# Patient Record
Sex: Female | Born: 1945 | Race: White | Hispanic: No | Marital: Married | Smoking: Never smoker
Health system: Southern US, Community
[De-identification: ages and names within clinical notes are randomized; demographics above are authoritative.]

## PROBLEM LIST (undated history)

## (undated) DIAGNOSIS — I4819 Other persistent atrial fibrillation: Secondary | ICD-10-CM

## (undated) DIAGNOSIS — E079 Disorder of thyroid, unspecified: Secondary | ICD-10-CM

## (undated) DIAGNOSIS — I5020 Unspecified systolic (congestive) heart failure: Secondary | ICD-10-CM

## (undated) DIAGNOSIS — I10 Essential (primary) hypertension: Secondary | ICD-10-CM

## (undated) HISTORY — PX: APPENDECTOMY: SHX54

## (undated) HISTORY — DX: Unspecified systolic (congestive) heart failure: I50.20

## (undated) HISTORY — PX: HYSTERECTOMY ABDOMINAL WITH SALPINGECTOMY: SHX6725

## (undated) HISTORY — DX: Essential (primary) hypertension: I10

## (undated) HISTORY — DX: Other persistent atrial fibrillation: I48.19

## (undated) HISTORY — DX: Disorder of thyroid, unspecified: E07.9

---

## 2001-08-09 ENCOUNTER — Encounter: Payer: Self-pay | Admitting: Endocrinology

## 2001-08-09 ENCOUNTER — Encounter: Admission: RE | Admit: 2001-08-09 | Discharge: 2001-08-09 | Payer: Self-pay | Admitting: Endocrinology

## 2003-04-15 ENCOUNTER — Encounter: Payer: Self-pay | Admitting: Endocrinology

## 2003-04-15 ENCOUNTER — Encounter: Admission: RE | Admit: 2003-04-15 | Discharge: 2003-04-15 | Payer: Self-pay | Admitting: Endocrinology

## 2004-11-25 ENCOUNTER — Encounter: Admission: RE | Admit: 2004-11-25 | Discharge: 2004-11-25 | Payer: Self-pay | Admitting: Endocrinology

## 2007-02-08 ENCOUNTER — Encounter: Admission: RE | Admit: 2007-02-08 | Discharge: 2007-02-08 | Payer: Self-pay | Admitting: Endocrinology

## 2010-12-07 ENCOUNTER — Other Ambulatory Visit: Payer: Self-pay | Admitting: Endocrinology

## 2010-12-07 DIAGNOSIS — Z1231 Encounter for screening mammogram for malignant neoplasm of breast: Secondary | ICD-10-CM

## 2010-12-30 ENCOUNTER — Ambulatory Visit
Admission: RE | Admit: 2010-12-30 | Discharge: 2010-12-30 | Disposition: A | Discharge status: Home / Self Care | Payer: 59 | Source: Ambulatory Visit | Attending: Endocrinology | Admitting: Endocrinology

## 2010-12-30 DIAGNOSIS — Z1231 Encounter for screening mammogram for malignant neoplasm of breast: Secondary | ICD-10-CM

## 2011-11-16 DIAGNOSIS — E039 Hypothyroidism, unspecified: Secondary | ICD-10-CM | POA: Diagnosis not present

## 2011-11-18 DIAGNOSIS — E039 Hypothyroidism, unspecified: Secondary | ICD-10-CM | POA: Diagnosis not present

## 2011-11-18 DIAGNOSIS — I10 Essential (primary) hypertension: Secondary | ICD-10-CM | POA: Diagnosis not present

## 2011-11-26 DIAGNOSIS — H2510 Age-related nuclear cataract, unspecified eye: Secondary | ICD-10-CM | POA: Diagnosis not present

## 2011-12-06 DIAGNOSIS — H2510 Age-related nuclear cataract, unspecified eye: Secondary | ICD-10-CM | POA: Diagnosis not present

## 2011-12-06 DIAGNOSIS — IMO0002 Reserved for concepts with insufficient information to code with codable children: Secondary | ICD-10-CM | POA: Diagnosis not present

## 2012-03-01 DIAGNOSIS — E669 Obesity, unspecified: Secondary | ICD-10-CM | POA: Diagnosis not present

## 2012-03-01 DIAGNOSIS — I10 Essential (primary) hypertension: Secondary | ICD-10-CM | POA: Diagnosis not present

## 2012-03-01 DIAGNOSIS — Z1331 Encounter for screening for depression: Secondary | ICD-10-CM | POA: Diagnosis not present

## 2012-03-01 DIAGNOSIS — Z1211 Encounter for screening for malignant neoplasm of colon: Secondary | ICD-10-CM | POA: Diagnosis not present

## 2012-05-16 DIAGNOSIS — E039 Hypothyroidism, unspecified: Secondary | ICD-10-CM | POA: Diagnosis not present

## 2012-05-16 DIAGNOSIS — I10 Essential (primary) hypertension: Secondary | ICD-10-CM | POA: Diagnosis not present

## 2012-05-18 DIAGNOSIS — I10 Essential (primary) hypertension: Secondary | ICD-10-CM | POA: Diagnosis not present

## 2012-05-18 DIAGNOSIS — E039 Hypothyroidism, unspecified: Secondary | ICD-10-CM | POA: Diagnosis not present

## 2012-08-31 DIAGNOSIS — Z13220 Encounter for screening for lipoid disorders: Secondary | ICD-10-CM | POA: Diagnosis not present

## 2012-08-31 DIAGNOSIS — I10 Essential (primary) hypertension: Secondary | ICD-10-CM | POA: Diagnosis not present

## 2012-08-31 DIAGNOSIS — M25559 Pain in unspecified hip: Secondary | ICD-10-CM | POA: Diagnosis not present

## 2012-08-31 DIAGNOSIS — Z136 Encounter for screening for cardiovascular disorders: Secondary | ICD-10-CM | POA: Diagnosis not present

## 2012-11-21 DIAGNOSIS — E039 Hypothyroidism, unspecified: Secondary | ICD-10-CM | POA: Diagnosis not present

## 2012-11-23 DIAGNOSIS — E039 Hypothyroidism, unspecified: Secondary | ICD-10-CM | POA: Diagnosis not present

## 2013-05-22 DIAGNOSIS — E039 Hypothyroidism, unspecified: Secondary | ICD-10-CM | POA: Diagnosis not present

## 2013-05-24 DIAGNOSIS — E039 Hypothyroidism, unspecified: Secondary | ICD-10-CM | POA: Diagnosis not present

## 2013-08-10 DIAGNOSIS — Z23 Encounter for immunization: Secondary | ICD-10-CM | POA: Diagnosis not present

## 2013-10-29 DIAGNOSIS — I10 Essential (primary) hypertension: Secondary | ICD-10-CM | POA: Diagnosis not present

## 2013-11-23 DIAGNOSIS — E039 Hypothyroidism, unspecified: Secondary | ICD-10-CM | POA: Diagnosis not present

## 2013-11-26 DIAGNOSIS — I10 Essential (primary) hypertension: Secondary | ICD-10-CM | POA: Diagnosis not present

## 2013-11-27 DIAGNOSIS — E039 Hypothyroidism, unspecified: Secondary | ICD-10-CM | POA: Diagnosis not present

## 2013-12-05 DIAGNOSIS — I10 Essential (primary) hypertension: Secondary | ICD-10-CM | POA: Diagnosis not present

## 2013-12-25 DIAGNOSIS — I10 Essential (primary) hypertension: Secondary | ICD-10-CM | POA: Diagnosis not present

## 2014-02-01 DIAGNOSIS — I10 Essential (primary) hypertension: Secondary | ICD-10-CM | POA: Diagnosis not present

## 2014-04-26 ENCOUNTER — Other Ambulatory Visit: Payer: Self-pay

## 2014-04-26 DIAGNOSIS — Z1231 Encounter for screening mammogram for malignant neoplasm of breast: Secondary | ICD-10-CM

## 2014-05-14 ENCOUNTER — Encounter (INDEPENDENT_AMBULATORY_CARE_PROVIDER_SITE_OTHER): Payer: Self-pay

## 2014-05-14 ENCOUNTER — Ambulatory Visit
Admission: RE | Admit: 2014-05-14 | Discharge: 2014-05-14 | Disposition: A | Discharge status: Home / Self Care | Payer: Medicare Other | Source: Ambulatory Visit

## 2014-05-14 DIAGNOSIS — Z1231 Encounter for screening mammogram for malignant neoplasm of breast: Secondary | ICD-10-CM | POA: Diagnosis not present

## 2014-05-27 DIAGNOSIS — E039 Hypothyroidism, unspecified: Secondary | ICD-10-CM | POA: Diagnosis not present

## 2014-05-30 DIAGNOSIS — E039 Hypothyroidism, unspecified: Secondary | ICD-10-CM | POA: Diagnosis not present

## 2014-06-11 DIAGNOSIS — Z5181 Encounter for therapeutic drug level monitoring: Secondary | ICD-10-CM | POA: Diagnosis not present

## 2014-06-11 DIAGNOSIS — Z1211 Encounter for screening for malignant neoplasm of colon: Secondary | ICD-10-CM | POA: Diagnosis not present

## 2014-06-11 DIAGNOSIS — Z6837 Body mass index (BMI) 37.0-37.9, adult: Secondary | ICD-10-CM | POA: Diagnosis not present

## 2014-06-11 DIAGNOSIS — Z23 Encounter for immunization: Secondary | ICD-10-CM | POA: Diagnosis not present

## 2014-06-11 DIAGNOSIS — I10 Essential (primary) hypertension: Secondary | ICD-10-CM | POA: Diagnosis not present

## 2014-06-13 DIAGNOSIS — Z1211 Encounter for screening for malignant neoplasm of colon: Secondary | ICD-10-CM | POA: Diagnosis not present

## 2014-11-26 DIAGNOSIS — E038 Other specified hypothyroidism: Secondary | ICD-10-CM | POA: Diagnosis not present

## 2014-11-27 DIAGNOSIS — H2512 Age-related nuclear cataract, left eye: Secondary | ICD-10-CM | POA: Diagnosis not present

## 2014-11-27 DIAGNOSIS — H11002 Unspecified pterygium of left eye: Secondary | ICD-10-CM | POA: Diagnosis not present

## 2014-11-28 DIAGNOSIS — E038 Other specified hypothyroidism: Secondary | ICD-10-CM | POA: Diagnosis not present

## 2014-11-28 DIAGNOSIS — Z23 Encounter for immunization: Secondary | ICD-10-CM | POA: Diagnosis not present

## 2014-12-10 DIAGNOSIS — M25559 Pain in unspecified hip: Secondary | ICD-10-CM | POA: Diagnosis not present

## 2014-12-10 DIAGNOSIS — M545 Low back pain: Secondary | ICD-10-CM | POA: Diagnosis not present

## 2014-12-10 DIAGNOSIS — I10 Essential (primary) hypertension: Secondary | ICD-10-CM | POA: Diagnosis not present

## 2014-12-10 DIAGNOSIS — Z6838 Body mass index (BMI) 38.0-38.9, adult: Secondary | ICD-10-CM | POA: Diagnosis not present

## 2015-05-26 DIAGNOSIS — E038 Other specified hypothyroidism: Secondary | ICD-10-CM | POA: Diagnosis not present

## 2015-05-27 DIAGNOSIS — Z23 Encounter for immunization: Secondary | ICD-10-CM | POA: Diagnosis not present

## 2015-05-27 DIAGNOSIS — E038 Other specified hypothyroidism: Secondary | ICD-10-CM | POA: Diagnosis not present

## 2015-06-11 DIAGNOSIS — Z0000 Encounter for general adult medical examination without abnormal findings: Secondary | ICD-10-CM | POA: Diagnosis not present

## 2015-06-11 DIAGNOSIS — Z1211 Encounter for screening for malignant neoplasm of colon: Secondary | ICD-10-CM | POA: Diagnosis not present

## 2015-06-11 DIAGNOSIS — I10 Essential (primary) hypertension: Secondary | ICD-10-CM | POA: Diagnosis not present

## 2015-06-11 DIAGNOSIS — E039 Hypothyroidism, unspecified: Secondary | ICD-10-CM | POA: Diagnosis not present

## 2015-06-11 DIAGNOSIS — Z6838 Body mass index (BMI) 38.0-38.9, adult: Secondary | ICD-10-CM | POA: Diagnosis not present

## 2015-06-11 DIAGNOSIS — M1612 Unilateral primary osteoarthritis, left hip: Secondary | ICD-10-CM | POA: Diagnosis not present

## 2015-06-19 DIAGNOSIS — Z1211 Encounter for screening for malignant neoplasm of colon: Secondary | ICD-10-CM | POA: Diagnosis not present

## 2015-06-19 DIAGNOSIS — R300 Dysuria: Secondary | ICD-10-CM | POA: Diagnosis not present

## 2015-07-10 DIAGNOSIS — Z23 Encounter for immunization: Secondary | ICD-10-CM | POA: Diagnosis not present

## 2015-10-28 ENCOUNTER — Other Ambulatory Visit: Payer: Self-pay

## 2015-10-28 DIAGNOSIS — Z1231 Encounter for screening mammogram for malignant neoplasm of breast: Secondary | ICD-10-CM

## 2015-11-13 ENCOUNTER — Ambulatory Visit
Admission: RE | Admit: 2015-11-13 | Discharge: 2015-11-13 | Disposition: A | Discharge status: Home / Self Care | Payer: Medicare Other | Source: Ambulatory Visit

## 2015-11-13 DIAGNOSIS — Z1231 Encounter for screening mammogram for malignant neoplasm of breast: Secondary | ICD-10-CM

## 2015-11-25 DIAGNOSIS — E038 Other specified hypothyroidism: Secondary | ICD-10-CM | POA: Diagnosis not present

## 2015-11-27 DIAGNOSIS — E038 Other specified hypothyroidism: Secondary | ICD-10-CM | POA: Diagnosis not present

## 2015-12-02 DIAGNOSIS — Z961 Presence of intraocular lens: Secondary | ICD-10-CM | POA: Diagnosis not present

## 2015-12-02 DIAGNOSIS — H2512 Age-related nuclear cataract, left eye: Secondary | ICD-10-CM | POA: Diagnosis not present

## 2015-12-09 DIAGNOSIS — I10 Essential (primary) hypertension: Secondary | ICD-10-CM | POA: Diagnosis not present

## 2015-12-09 DIAGNOSIS — E782 Mixed hyperlipidemia: Secondary | ICD-10-CM | POA: Diagnosis not present

## 2015-12-09 DIAGNOSIS — M1612 Unilateral primary osteoarthritis, left hip: Secondary | ICD-10-CM | POA: Diagnosis not present

## 2016-03-03 DIAGNOSIS — E038 Other specified hypothyroidism: Secondary | ICD-10-CM | POA: Diagnosis not present

## 2016-03-05 DIAGNOSIS — E038 Other specified hypothyroidism: Secondary | ICD-10-CM | POA: Diagnosis not present

## 2016-06-14 DIAGNOSIS — Z1211 Encounter for screening for malignant neoplasm of colon: Secondary | ICD-10-CM | POA: Diagnosis not present

## 2016-06-14 DIAGNOSIS — I10 Essential (primary) hypertension: Secondary | ICD-10-CM | POA: Diagnosis not present

## 2016-06-14 DIAGNOSIS — E669 Obesity, unspecified: Secondary | ICD-10-CM | POA: Diagnosis not present

## 2016-06-15 DIAGNOSIS — E038 Other specified hypothyroidism: Secondary | ICD-10-CM | POA: Diagnosis not present

## 2016-06-17 DIAGNOSIS — E038 Other specified hypothyroidism: Secondary | ICD-10-CM | POA: Diagnosis not present

## 2016-06-17 DIAGNOSIS — Z23 Encounter for immunization: Secondary | ICD-10-CM | POA: Diagnosis not present

## 2016-06-21 DIAGNOSIS — Z1211 Encounter for screening for malignant neoplasm of colon: Secondary | ICD-10-CM | POA: Diagnosis not present

## 2016-09-28 DIAGNOSIS — H33301 Unspecified retinal break, right eye: Secondary | ICD-10-CM | POA: Diagnosis not present

## 2016-09-29 DIAGNOSIS — H4311 Vitreous hemorrhage, right eye: Secondary | ICD-10-CM | POA: Diagnosis not present

## 2016-09-29 DIAGNOSIS — H43813 Vitreous degeneration, bilateral: Secondary | ICD-10-CM | POA: Diagnosis not present

## 2016-09-29 DIAGNOSIS — H33311 Horseshoe tear of retina without detachment, right eye: Secondary | ICD-10-CM | POA: Diagnosis not present

## 2016-10-08 DIAGNOSIS — H33311 Horseshoe tear of retina without detachment, right eye: Secondary | ICD-10-CM | POA: Diagnosis not present

## 2016-11-30 DIAGNOSIS — E039 Hypothyroidism, unspecified: Secondary | ICD-10-CM | POA: Diagnosis not present

## 2016-12-02 DIAGNOSIS — H4312 Vitreous hemorrhage, left eye: Secondary | ICD-10-CM | POA: Diagnosis not present

## 2016-12-24 DIAGNOSIS — E039 Hypothyroidism, unspecified: Secondary | ICD-10-CM | POA: Insufficient documentation

## 2016-12-24 DIAGNOSIS — Z79899 Other long term (current) drug therapy: Secondary | ICD-10-CM | POA: Diagnosis not present

## 2017-01-11 DIAGNOSIS — E039 Hypothyroidism, unspecified: Secondary | ICD-10-CM | POA: Diagnosis not present

## 2017-01-11 DIAGNOSIS — I10 Essential (primary) hypertension: Secondary | ICD-10-CM | POA: Diagnosis not present

## 2017-01-11 DIAGNOSIS — Z6839 Body mass index (BMI) 39.0-39.9, adult: Secondary | ICD-10-CM | POA: Diagnosis not present

## 2017-01-11 DIAGNOSIS — Z0000 Encounter for general adult medical examination without abnormal findings: Secondary | ICD-10-CM | POA: Diagnosis not present

## 2017-01-11 DIAGNOSIS — M1612 Unilateral primary osteoarthritis, left hip: Secondary | ICD-10-CM | POA: Diagnosis not present

## 2017-01-11 DIAGNOSIS — E782 Mixed hyperlipidemia: Secondary | ICD-10-CM | POA: Diagnosis not present

## 2017-01-11 DIAGNOSIS — Z23 Encounter for immunization: Secondary | ICD-10-CM | POA: Diagnosis not present

## 2017-02-01 DIAGNOSIS — H4311 Vitreous hemorrhage, right eye: Secondary | ICD-10-CM | POA: Diagnosis not present

## 2017-04-07 DIAGNOSIS — H33301 Unspecified retinal break, right eye: Secondary | ICD-10-CM | POA: Diagnosis not present

## 2017-06-17 DIAGNOSIS — E039 Hypothyroidism, unspecified: Secondary | ICD-10-CM | POA: Diagnosis not present

## 2017-06-27 DIAGNOSIS — E039 Hypothyroidism, unspecified: Secondary | ICD-10-CM | POA: Diagnosis not present

## 2017-06-27 DIAGNOSIS — Z23 Encounter for immunization: Secondary | ICD-10-CM | POA: Diagnosis not present

## 2017-10-11 DIAGNOSIS — D3141 Benign neoplasm of right ciliary body: Secondary | ICD-10-CM | POA: Diagnosis not present

## 2017-10-11 DIAGNOSIS — H26492 Other secondary cataract, left eye: Secondary | ICD-10-CM | POA: Diagnosis not present

## 2017-10-11 DIAGNOSIS — D3131 Benign neoplasm of right choroid: Secondary | ICD-10-CM | POA: Diagnosis not present

## 2017-10-21 ENCOUNTER — Other Ambulatory Visit: Payer: Self-pay | Admitting: Family Medicine

## 2017-10-21 DIAGNOSIS — Z139 Encounter for screening, unspecified: Secondary | ICD-10-CM

## 2017-11-15 ENCOUNTER — Ambulatory Visit
Admission: RE | Admit: 2017-11-15 | Discharge: 2017-11-15 | Disposition: A | Discharge status: Home / Self Care | Payer: Medicare Other | Source: Ambulatory Visit | Attending: Family Medicine | Admitting: Family Medicine

## 2017-11-15 DIAGNOSIS — Z1231 Encounter for screening mammogram for malignant neoplasm of breast: Secondary | ICD-10-CM | POA: Diagnosis not present

## 2017-11-15 DIAGNOSIS — Z139 Encounter for screening, unspecified: Secondary | ICD-10-CM

## 2017-11-16 DIAGNOSIS — H26491 Other secondary cataract, right eye: Secondary | ICD-10-CM | POA: Diagnosis not present

## 2017-12-22 DIAGNOSIS — E039 Hypothyroidism, unspecified: Secondary | ICD-10-CM | POA: Diagnosis not present

## 2017-12-26 DIAGNOSIS — E039 Hypothyroidism, unspecified: Secondary | ICD-10-CM | POA: Diagnosis not present

## 2017-12-26 DIAGNOSIS — Z8639 Personal history of other endocrine, nutritional and metabolic disease: Secondary | ICD-10-CM | POA: Diagnosis not present

## 2017-12-26 DIAGNOSIS — Z79890 Hormone replacement therapy (postmenopausal): Secondary | ICD-10-CM | POA: Diagnosis not present

## 2018-03-03 DIAGNOSIS — E782 Mixed hyperlipidemia: Secondary | ICD-10-CM | POA: Diagnosis not present

## 2018-03-03 DIAGNOSIS — Z0000 Encounter for general adult medical examination without abnormal findings: Secondary | ICD-10-CM | POA: Diagnosis not present

## 2018-03-03 DIAGNOSIS — M1612 Unilateral primary osteoarthritis, left hip: Secondary | ICD-10-CM | POA: Diagnosis not present

## 2018-03-03 DIAGNOSIS — Z1211 Encounter for screening for malignant neoplasm of colon: Secondary | ICD-10-CM | POA: Diagnosis not present

## 2018-03-03 DIAGNOSIS — I10 Essential (primary) hypertension: Secondary | ICD-10-CM | POA: Diagnosis not present

## 2018-03-03 DIAGNOSIS — E039 Hypothyroidism, unspecified: Secondary | ICD-10-CM | POA: Diagnosis not present

## 2018-06-21 DIAGNOSIS — E039 Hypothyroidism, unspecified: Secondary | ICD-10-CM | POA: Diagnosis not present

## 2018-06-27 DIAGNOSIS — Z23 Encounter for immunization: Secondary | ICD-10-CM | POA: Diagnosis not present

## 2018-06-27 DIAGNOSIS — E039 Hypothyroidism, unspecified: Secondary | ICD-10-CM | POA: Diagnosis not present

## 2018-09-04 DIAGNOSIS — E039 Hypothyroidism, unspecified: Secondary | ICD-10-CM | POA: Diagnosis not present

## 2018-09-04 DIAGNOSIS — I10 Essential (primary) hypertension: Secondary | ICD-10-CM | POA: Diagnosis not present

## 2018-09-04 DIAGNOSIS — M1612 Unilateral primary osteoarthritis, left hip: Secondary | ICD-10-CM | POA: Diagnosis not present

## 2018-10-05 DIAGNOSIS — H524 Presbyopia: Secondary | ICD-10-CM | POA: Diagnosis not present

## 2018-10-05 DIAGNOSIS — D3131 Benign neoplasm of right choroid: Secondary | ICD-10-CM | POA: Diagnosis not present

## 2018-10-05 DIAGNOSIS — H33301 Unspecified retinal break, right eye: Secondary | ICD-10-CM | POA: Diagnosis not present

## 2018-12-22 DIAGNOSIS — E039 Hypothyroidism, unspecified: Secondary | ICD-10-CM | POA: Diagnosis not present

## 2018-12-28 DIAGNOSIS — E039 Hypothyroidism, unspecified: Secondary | ICD-10-CM | POA: Diagnosis not present

## 2019-03-07 DIAGNOSIS — I10 Essential (primary) hypertension: Secondary | ICD-10-CM | POA: Diagnosis not present

## 2019-03-07 DIAGNOSIS — E782 Mixed hyperlipidemia: Secondary | ICD-10-CM | POA: Diagnosis not present

## 2019-03-07 DIAGNOSIS — E039 Hypothyroidism, unspecified: Secondary | ICD-10-CM | POA: Diagnosis not present

## 2019-03-07 DIAGNOSIS — M1612 Unilateral primary osteoarthritis, left hip: Secondary | ICD-10-CM | POA: Diagnosis not present

## 2019-07-03 DIAGNOSIS — E039 Hypothyroidism, unspecified: Secondary | ICD-10-CM | POA: Diagnosis not present

## 2019-07-05 DIAGNOSIS — Z23 Encounter for immunization: Secondary | ICD-10-CM | POA: Diagnosis not present

## 2019-07-05 DIAGNOSIS — E039 Hypothyroidism, unspecified: Secondary | ICD-10-CM | POA: Diagnosis not present

## 2019-08-14 DIAGNOSIS — H43813 Vitreous degeneration, bilateral: Secondary | ICD-10-CM | POA: Diagnosis not present

## 2019-08-14 DIAGNOSIS — H43811 Vitreous degeneration, right eye: Secondary | ICD-10-CM | POA: Diagnosis not present

## 2019-08-14 DIAGNOSIS — H4311 Vitreous hemorrhage, right eye: Secondary | ICD-10-CM | POA: Diagnosis not present

## 2019-08-14 DIAGNOSIS — H35371 Puckering of macula, right eye: Secondary | ICD-10-CM | POA: Diagnosis not present

## 2019-08-14 DIAGNOSIS — H43393 Other vitreous opacities, bilateral: Secondary | ICD-10-CM | POA: Diagnosis not present

## 2019-11-13 DIAGNOSIS — H2512 Age-related nuclear cataract, left eye: Secondary | ICD-10-CM | POA: Diagnosis not present

## 2019-11-13 DIAGNOSIS — H35371 Puckering of macula, right eye: Secondary | ICD-10-CM | POA: Diagnosis not present

## 2019-11-13 DIAGNOSIS — H43813 Vitreous degeneration, bilateral: Secondary | ICD-10-CM | POA: Diagnosis not present

## 2019-11-13 DIAGNOSIS — H43393 Other vitreous opacities, bilateral: Secondary | ICD-10-CM | POA: Diagnosis not present

## 2019-11-29 DIAGNOSIS — Z23 Encounter for immunization: Secondary | ICD-10-CM | POA: Diagnosis not present

## 2020-01-01 DIAGNOSIS — E039 Hypothyroidism, unspecified: Secondary | ICD-10-CM | POA: Diagnosis not present

## 2020-01-04 DIAGNOSIS — E039 Hypothyroidism, unspecified: Secondary | ICD-10-CM | POA: Diagnosis not present

## 2020-01-04 DIAGNOSIS — Z8639 Personal history of other endocrine, nutritional and metabolic disease: Secondary | ICD-10-CM | POA: Diagnosis not present

## 2020-01-04 DIAGNOSIS — Z23 Encounter for immunization: Secondary | ICD-10-CM | POA: Diagnosis not present

## 2020-03-11 DIAGNOSIS — I10 Essential (primary) hypertension: Secondary | ICD-10-CM | POA: Diagnosis not present

## 2020-04-21 ENCOUNTER — Other Ambulatory Visit: Payer: Self-pay | Admitting: Family Medicine

## 2020-04-21 DIAGNOSIS — Z1231 Encounter for screening mammogram for malignant neoplasm of breast: Secondary | ICD-10-CM

## 2020-05-09 DIAGNOSIS — H35371 Puckering of macula, right eye: Secondary | ICD-10-CM | POA: Diagnosis not present

## 2020-05-09 DIAGNOSIS — H31091 Other chorioretinal scars, right eye: Secondary | ICD-10-CM | POA: Diagnosis not present

## 2020-05-09 DIAGNOSIS — H43391 Other vitreous opacities, right eye: Secondary | ICD-10-CM | POA: Diagnosis not present

## 2020-05-09 DIAGNOSIS — H43813 Vitreous degeneration, bilateral: Secondary | ICD-10-CM | POA: Diagnosis not present

## 2020-05-15 ENCOUNTER — Other Ambulatory Visit: Payer: Self-pay

## 2020-05-15 ENCOUNTER — Ambulatory Visit
Admission: RE | Admit: 2020-05-15 | Discharge: 2020-05-15 | Disposition: A | Discharge status: Home / Self Care | Payer: Medicare Other | Source: Ambulatory Visit | Attending: Family Medicine | Admitting: Family Medicine

## 2020-05-15 DIAGNOSIS — Z1231 Encounter for screening mammogram for malignant neoplasm of breast: Secondary | ICD-10-CM

## 2020-07-07 DIAGNOSIS — E039 Hypothyroidism, unspecified: Secondary | ICD-10-CM | POA: Diagnosis not present

## 2020-07-07 DIAGNOSIS — Z8639 Personal history of other endocrine, nutritional and metabolic disease: Secondary | ICD-10-CM | POA: Diagnosis not present

## 2020-07-11 DIAGNOSIS — Z8639 Personal history of other endocrine, nutritional and metabolic disease: Secondary | ICD-10-CM | POA: Diagnosis not present

## 2020-07-11 DIAGNOSIS — E039 Hypothyroidism, unspecified: Secondary | ICD-10-CM | POA: Diagnosis not present

## 2020-07-18 ENCOUNTER — Other Ambulatory Visit: Payer: Self-pay

## 2020-07-18 ENCOUNTER — Telehealth: Payer: Self-pay

## 2020-07-18 ENCOUNTER — Encounter: Payer: Self-pay | Admitting: Cardiovascular Disease

## 2020-07-18 ENCOUNTER — Ambulatory Visit (INDEPENDENT_AMBULATORY_CARE_PROVIDER_SITE_OTHER): Payer: Medicare Other | Admitting: Cardiovascular Disease

## 2020-07-18 VITALS — BP 128/78 | HR 90 | Ht 63.0 in | Wt 216.0 lb

## 2020-07-18 DIAGNOSIS — I4891 Unspecified atrial fibrillation: Secondary | ICD-10-CM | POA: Diagnosis not present

## 2020-07-18 DIAGNOSIS — Z23 Encounter for immunization: Secondary | ICD-10-CM | POA: Diagnosis not present

## 2020-07-18 DIAGNOSIS — I10 Essential (primary) hypertension: Secondary | ICD-10-CM | POA: Diagnosis not present

## 2020-07-18 DIAGNOSIS — R000 Tachycardia, unspecified: Secondary | ICD-10-CM | POA: Diagnosis not present

## 2020-07-18 MED ORDER — METOPROLOL TARTRATE 25 MG PO TABS: 25.0000 mg | ORAL_TABLET | Freq: Two times a day (BID) | ORAL | 3 refills | Status: DC

## 2020-07-18 MED ORDER — APIXABAN 5 MG PO TABS: 5.0000 mg | ORAL_TABLET | Freq: Two times a day (BID) | ORAL | 3 refills | Status: DC

## 2020-07-18 NOTE — Progress Notes (Signed)
Cardiology Office Note:    Date:  07/18/2020   ID:  Michelle Watkins, DOB 05-17-1946, MRN [id number]  PCP:  Orpah Melter, MD  [facility] Cardiologist:  No primary care provider on file.  [facility] Electrophysiologist:  None   Referring MD: Orpah Melter, MD   Chief Complaint  Patient presents with  . Atrial Fibrillation    History of Present Illness:    Michelle Watkins is a 74 y.o. female referred for evaluation of atrial fibrillation by Dr Olen Pel.   The patient is here alone. She was seen by her endocrinologist last week and was noted to have a pulse rate of 126 bpm. She states her normal heart rate is in the range of 60-70's. She went to see Dr Olen Pel this morning and was noted to be in atrial fibrillation with RVR with a heart rate of 147 bpm. She has been checking her pulse at home and it has generally ranged from 100-140 bpm over the past week.   She denies any perception of heart palpitations or 'heart racing.' She hasn't done much physical activity this week because she has been concerned about her heart rate. She specifically denies chest pain, chest pressure, or shortness of breath. She denies lightheadedness or syncope. She recently had to place her mother in a skilled nursing facility and has had to sell her mother's house. States that all of this has been very stressful for her.   Past Medical History:  Diagnosis Date  . Hypertension   . Thyroid disease     Past Surgical History:  Procedure Laterality Date  . APPENDECTOMY    . HYSTERECTOMY ABDOMINAL WITH SALPINGECTOMY      Current Medications: Current Meds  Medication Sig  . acetaminophen (TYLENOL) 650 MG CR tablet Take 650 mg by mouth every 8 (eight) hours as needed.  Marland Kitchen amLODipine (NORVASC) 2.5 MG tablet Take 2.5 mg by mouth daily.  Marland Kitchen levothyroxine (SYNTHROID) 125 MCG tablet Take 125 mcg by mouth daily before breakfast.  . lisinopril (ZESTRIL) 40 MG tablet Take 40 mg by mouth daily.     Allergies:    Patient has no allergy information on record.   Social History   Socioeconomic History  . Marital status: Married    Spouse name: Not on file  . Number of children: Not on file  . Years of education: Not on file  . Highest education level: Not on file  Occupational History  . Not on file  Tobacco Use  . Smoking status: Never Smoker  . Smokeless tobacco: Never Used  Substance and Sexual Activity  . Alcohol use: Never  . Drug use: Never  . Sexual activity: Not on file  Other Topics Concern  . Not on file  Social History Narrative  . Not on file   Social Determinants of Health   Financial Resource Strain:   . Difficulty of Paying Living Expenses: Not on file  Food Insecurity:   . Worried About Charity fundraiser in the Last Year: Not on file  . Ran Out of Food in the Last Year: Not on file  Transportation Needs:   . Lack of Transportation (Medical): Not on file  . Lack of Transportation (Non-Medical): Not on file  Physical Activity:   . Days of Exercise per Week: Not on file  . Minutes of Exercise per Session: Not on file  Stress:   . Feeling of Stress : Not on file  Social Connections:   . Frequency of Communication  with Friends and Family: Not on file  . Frequency of Social Gatherings with Friends and Family: Not on file  . Attends Religious Services: Not on file  . Active Member of Clubs or Organizations: Not on file  . Attends Archivist Meetings: Not on file  . Marital Status: Not on file     Family History: The patient's family history is not on file.  ROS:   Please see the history of present illness.    All other systems reviewed and are negative.  EKGs/Labs/Other Studies Reviewed:    EKG:  EKG is not ordered today.  The ekg ordered today by Dr Olen Pel demonstrates atrial fibrillation with RVR 147 bpm  Recent Labs: No results found for requested labs within last 8760 hours.  Recent Lipid Panel No results found for: CHOL, TRIG, HDL,  CHOLHDL, VLDL, LDLCALC, LDLDIRECT   Risk Assessment/Calculations:     CHA2DS2-VASc Score = 3  This indicates a 3.2% annual risk of stroke. The patient's score is based upon: CHF History: 0 HTN History: 1 Diabetes History: 0 Stroke History: 0 Vascular Disease History: 0 Age Score: 1 Gender Score: 1      Physical Exam:    VS:  BP 128/78   Pulse 90   Ht 5\' 3"  (1.6 m)   Wt 216 lb (98 kg)   SpO2 98%   BMI 38.26 kg/m     Wt Readings from Last 3 Encounters:  07/18/20 216 lb (98 kg)     GEN:  Well nourished, well developed in no acute distress HEENT: Normal NECK: No JVD; No carotid bruits LYMPHATICS: No lymphadenopathy CARDIAC: irregularly irregular, no murmurs, rubs, gallops RESPIRATORY:  Clear to auscultation without rales, wheezing or rhonchi  ABDOMEN: Soft, non-tender, non-distended MUSCULOSKELETAL:  No edema; No deformity  SKIN: Warm and dry NEUROLOGIC:  Alert and oriented x 3 PSYCHIATRIC:  Normal affect   ASSESSMENT:    1. Atrial fibrillation with rapid ventricular response ([place])   2. Essential hypertension    PLAN:    In order of problems listed above:  1. The patient has a new diagnosis of atrial fibrillation with RVR. She appears to be asymptomatic. By history it appears that she has been in atrial fibrillation for at least 1 week. We discussed the natural history of atrial fibrillation today. We reviewed treatment options and contrasted rate control versus rhythm control. We discussed pros and cons of oral anticoagulation therapy. This patient's CHA2DS2-VASc score is equal to 3 and oral anticoagulation is indicated. I have recommended apixaban 5 mg twice daily which she agrees to start immediately. We will add metoprolol 25 mg twice daily for heart rate control. She will discontinue lisinopril to allow plenty of blood pressure room for metoprolol. She was wanting to discontinue lisinopril because of chronic nonproductive cough. She has had recent thyroid  function testing done which is reportedly normal. Will obtain some baseline labs to include a CBC and metabolic panel. Will obtain an echocardiogram to assess for any evidence of LV dysfunction, valvular disease, and assessment of atrial size. Once initial testing is completed, I have recommended moving forward with elective cardioversion. I have reviewed risks, indications, and alternatives of cardioversion. She will need to complete 3 full weeks of anticoagulation prior to elective cardioversion. After cardioversion, we will ask her to follow-up in the atrial fibrillation clinic. 2. Appears to be controlled. Check echo to assess for LVH or any structural heart disease. Continue amlodipine 2.5 mg daily. Stop lisinopril as above.  Initiate metoprolol 25 mg twice daily for better heart rate control.   Medication Adjustments/Labs and Tests Ordered: Current medicines are reviewed at length with the patient today.  Concerns regarding medicines are outlined above.  No orders of the defined types were placed in this encounter.  No orders of the defined types were placed in this encounter.   There are no Patient Instructions on file for this visit.   Signed, Sherren Mocha, MD  07/18/2020 1:32 PM    [facility]

## 2020-07-18 NOTE — H&P (View-Only) (Signed)
Cardiology Office Note:    Date:  07/18/2020   ID:  Michelle Watkins, DOB September 12, 1946, MRN [id number]  PCP:  Orpah Melter, MD  [facility] Cardiologist:  No primary care provider on file.  [facility] Electrophysiologist:  None   Referring MD: Orpah Melter, MD   Chief Complaint  Patient presents with  . Atrial Fibrillation    History of Present Illness:    Michelle Watkins is a 74 y.o. female referred for evaluation of atrial fibrillation by Dr Olen Pel.   The patient is here alone. She was seen by her endocrinologist last week and was noted to have a pulse rate of 126 bpm. She states her normal heart rate is in the range of 60-70's. She went to see Dr Olen Pel this morning and was noted to be in atrial fibrillation with RVR with a heart rate of 147 bpm. She has been checking her pulse at home and it has generally ranged from 100-140 bpm over the past week.   She denies any perception of heart palpitations or 'heart racing.' She hasn't done much physical activity this week because she has been concerned about her heart rate. She specifically denies chest pain, chest pressure, or shortness of breath. She denies lightheadedness or syncope. She recently had to place her mother in a skilled nursing facility and has had to sell her mother's house. States that all of this has been very stressful for her.   Past Medical History:  Diagnosis Date  . Hypertension   . Thyroid disease     Past Surgical History:  Procedure Laterality Date  . APPENDECTOMY    . HYSTERECTOMY ABDOMINAL WITH SALPINGECTOMY      Current Medications: Current Meds  Medication Sig  . acetaminophen (TYLENOL) 650 MG CR tablet Take 650 mg by mouth every 8 (eight) hours as needed.  Marland Kitchen amLODipine (NORVASC) 2.5 MG tablet Take 2.5 mg by mouth daily.  Marland Kitchen levothyroxine (SYNTHROID) 125 MCG tablet Take 125 mcg by mouth daily before breakfast.  . lisinopril (ZESTRIL) 40 MG tablet Take 40 mg by mouth daily.     Allergies:    Patient has no allergy information on record.   Social History   Socioeconomic History  . Marital status: Married    Spouse name: Not on file  . Number of children: Not on file  . Years of education: Not on file  . Highest education level: Not on file  Occupational History  . Not on file  Tobacco Use  . Smoking status: Never Smoker  . Smokeless tobacco: Never Used  Substance and Sexual Activity  . Alcohol use: Never  . Drug use: Never  . Sexual activity: Not on file  Other Topics Concern  . Not on file  Social History Narrative  . Not on file   Social Determinants of Health   Financial Resource Strain:   . Difficulty of Paying Living Expenses: Not on file  Food Insecurity:   . Worried About Charity fundraiser in the Last Year: Not on file  . Ran Out of Food in the Last Year: Not on file  Transportation Needs:   . Lack of Transportation (Medical): Not on file  . Lack of Transportation (Non-Medical): Not on file  Physical Activity:   . Days of Exercise per Week: Not on file  . Minutes of Exercise per Session: Not on file  Stress:   . Feeling of Stress : Not on file  Social Connections:   . Frequency of Communication  with Friends and Family: Not on file  . Frequency of Social Gatherings with Friends and Family: Not on file  . Attends Religious Services: Not on file  . Active Member of Clubs or Organizations: Not on file  . Attends Archivist Meetings: Not on file  . Marital Status: Not on file     Family History: The patient's family history is not on file.  ROS:   Please see the history of present illness.    All other systems reviewed and are negative.  EKGs/Labs/Other Studies Reviewed:    EKG:  EKG is not ordered today.  The ekg ordered today by Dr Olen Pel demonstrates atrial fibrillation with RVR 147 bpm  Recent Labs: No results found for requested labs within last 8760 hours.  Recent Lipid Panel No results found for: CHOL, TRIG, HDL,  CHOLHDL, VLDL, LDLCALC, LDLDIRECT   Risk Assessment/Calculations:     CHA2DS2-VASc Score = 3  This indicates a 3.2% annual risk of stroke. The patient's score is based upon: CHF History: 0 HTN History: 1 Diabetes History: 0 Stroke History: 0 Vascular Disease History: 0 Age Score: 1 Gender Score: 1      Physical Exam:    VS:  BP 128/78   Pulse 90   Ht 5\' 3"  (1.6 m)   Wt 216 lb (98 kg)   SpO2 98%   BMI 38.26 kg/m     Wt Readings from Last 3 Encounters:  07/18/20 216 lb (98 kg)     GEN:  Well nourished, well developed in no acute distress HEENT: Normal NECK: No JVD; No carotid bruits LYMPHATICS: No lymphadenopathy CARDIAC: irregularly irregular, no murmurs, rubs, gallops RESPIRATORY:  Clear to auscultation without rales, wheezing or rhonchi  ABDOMEN: Soft, non-tender, non-distended MUSCULOSKELETAL:  No edema; No deformity  SKIN: Warm and dry NEUROLOGIC:  Alert and oriented x 3 PSYCHIATRIC:  Normal affect   ASSESSMENT:    1. Atrial fibrillation with rapid ventricular response ([place])   2. Essential hypertension    PLAN:    In order of problems listed above:  1. The patient has a new diagnosis of atrial fibrillation with RVR. She appears to be asymptomatic. By history it appears that she has been in atrial fibrillation for at least 1 week. We discussed the natural history of atrial fibrillation today. We reviewed treatment options and contrasted rate control versus rhythm control. We discussed pros and cons of oral anticoagulation therapy. This patient's CHA2DS2-VASc score is equal to 3 and oral anticoagulation is indicated. I have recommended apixaban 5 mg twice daily which she agrees to start immediately. We will add metoprolol 25 mg twice daily for heart rate control. She will discontinue lisinopril to allow plenty of blood pressure room for metoprolol. She was wanting to discontinue lisinopril because of chronic nonproductive cough. She has had recent thyroid  function testing done which is reportedly normal. Will obtain some baseline labs to include a CBC and metabolic panel. Will obtain an echocardiogram to assess for any evidence of LV dysfunction, valvular disease, and assessment of atrial size. Once initial testing is completed, I have recommended moving forward with elective cardioversion. I have reviewed risks, indications, and alternatives of cardioversion. She will need to complete 3 full weeks of anticoagulation prior to elective cardioversion. After cardioversion, we will ask her to follow-up in the atrial fibrillation clinic. 2. Appears to be controlled. Check echo to assess for LVH or any structural heart disease. Continue amlodipine 2.5 mg daily. Stop lisinopril as above.  Initiate metoprolol 25 mg twice daily for better heart rate control.   Medication Adjustments/Labs and Tests Ordered: Current medicines are reviewed at length with the patient today.  Concerns regarding medicines are outlined above.  No orders of the defined types were placed in this encounter.  No orders of the defined types were placed in this encounter.   There are no Patient Instructions on file for this visit.   Signed, Sherren Mocha, MD  07/18/2020 1:32 PM    [facility]

## 2020-07-18 NOTE — Patient Instructions (Signed)
Medication Instructions:  1) START METOPROLOL 25 mg twice daily 2) START ELIQUIS 5 mg twice daily 3) STOP LISINOPRIL  *If you need a refill on your cardiac medications before your next appointment, please call your pharmacy*  Lab Work: TODAY! BMET, CBC   Testing/Procedures: Your physician has requested that you have an echocardiogram. Echocardiography is a painless test that uses sound waves to create images of your heart. It provides your doctor with information about the size and shape of your heart and how well your heart's chambers and valves are working. This procedure takes approximately one hour. There are no restrictions for this procedure.  Your physician has recommended that you have a Cardioversion (DCCV). Electrical Cardioversion uses a jolt of electricity to your heart either through paddles or wired patches attached to your chest. This is a controlled, usually prescheduled, procedure. Defibrillation is done under light anesthesia in the hospital, and you usually go home the day of the procedure. This is done to get your heart back into a normal rhythm. You are not awake for the procedure. Please see the instruction sheet given to you today.   ECHO AND COVID SCREENING INFORMATION (11/17): You are scheduled for you echocardiogram on 08/13/20 at 10:35AM. Please arrive 15 minutes prior to your appointment for check-in. Dr. Antionette Char office: [address]  Directly after your echocardiogram, please proceed to have your drive-through Covid test: Pre-Procedural COVID-19 Testing Site [address] [place], [place] [address] You will need to go home after your screening and quarantine until your procedure.   CARDIOVERSION INFORMATION (11/19): You are scheduled for a Cardioversion on: Please arrive at the [facility] (Main Entrance A) at [facility]: [address] [place], [place] [address] at 10:00AM.  You are allowed ONE visitor in the waiting room during your  procedure. Both you and you guest must wear masks.  DIET: Nothing to eat or drink after midnight except a sip of water with medications.  Medication Instructions: 1) MAKE SURE TO TAKE YOUR ELIQUIS twice daily as directed, including the morning of your procedure. Make sure to let us know if you miss any doses! 2) You may take your other meds as directed with sips of water the morning of your procedure  You must have a responsible person to drive you home and stay in the waiting area during your procedure. Failure to do so could result in cancellation.  Bring your insurance cards.  *Special Note: Every effort is made to have your procedure done on time. Occasionally there are emergencies that occur at the hospital that may cause delays. Please be patient if a delay does occur.   FOLLOW-UP (11/30): You have scheduled follow-up in the AFIB CLINIC on: 08/26/20 at 10:30AM. The AFIB CLINIC is located at [facility], Entrance C (off [address]) in the [facility].  You will pull in a go to the right in the parking garage. Parking code: [id number]  Phone: [phone]

## 2020-07-18 NOTE — Telephone Encounter (Signed)
Spoke with Dr. Olen Pel. He is currently with the patient now who is in new onset afib of unknown duration who is completely asymptomatic. Her HR is in the 140s. Per his request, will call the patient and schedule her to be evaluated by Cardiology today.   Called and spoke with the patient. Added her to Dr. Antionette Char schedule today at 1320. She was grateful for call and agrees with plan.

## 2020-07-18 NOTE — Progress Notes (Signed)
DCCV scheduled 11/19 with Dr. Sallyanne Kuster. Case [phone]

## 2020-07-19 LAB — BASIC METABOLIC PANEL
BUN/Creatinine Ratio: 20 (ref 12–28)
BUN: 18 mg/dL (ref 8–27)
CO2: 23 mmol/L (ref 20–29)
Calcium: 9.5 mg/dL (ref 8.7–10.3)
Chloride: 103 mmol/L (ref 96–106)
Creatinine, Ser: 0.9 mg/dL (ref 0.57–1.00)
GFR calc Af Amer: 73 mL/min/{1.73_m2} (ref >59)
GFR calc non Af Amer: 63 mL/min/{1.73_m2} (ref >59)
Glucose: 91 mg/dL (ref 65–99)
Potassium: 4.8 mmol/L (ref 3.5–5.2)
Sodium: 139 mmol/L (ref 134–144)

## 2020-07-19 LAB — CBC WITH DIFFERENTIAL/PLATELET
Basophils Absolute: 0.1 10*3/uL (ref 0.0–0.2)
Basos: 1 %
EOS (ABSOLUTE): 0.1 10*3/uL (ref 0.0–0.4)
Eos: 1 %
Hematocrit: 43 % (ref 34.0–46.6)
Hemoglobin: 13.8 g/dL (ref 11.1–15.9)
Immature Grans (Abs): 0 10*3/uL (ref 0.0–0.1)
Immature Granulocytes: 0 %
Lymphocytes Absolute: 2.8 10*3/uL (ref 0.7–3.1)
Lymphs: 32 %
MCH: 28.2 pg (ref 26.6–33.0)
MCHC: 32.1 g/dL (ref 31.5–35.7)
MCV: 88 fL (ref 79–97)
Monocytes Absolute: 0.9 10*3/uL (ref 0.1–0.9)
Monocytes: 11 %
Neutrophils Absolute: 4.8 10*3/uL (ref 1.4–7.0)
Neutrophils: 55 %
Platelets: 319 10*3/uL (ref 150–450)
RBC: 4.89 x10E6/uL (ref 3.77–5.28)
RDW: 14.4 % (ref 11.7–15.4)
WBC: 8.7 10*3/uL (ref 3.4–10.8)

## 2020-07-22 ENCOUNTER — Other Ambulatory Visit: Payer: Self-pay | Admitting: Cardiovascular Disease

## 2020-08-13 ENCOUNTER — Other Ambulatory Visit: Payer: Self-pay

## 2020-08-13 ENCOUNTER — Other Ambulatory Visit (HOSPITAL_COMMUNITY)
Admission: RE | Admit: 2020-08-13 | Discharge: 2020-08-13 | Disposition: A | Discharge status: Home / Self Care | Payer: Medicare Other | Source: Ambulatory Visit | Attending: Cardiovascular Disease | Admitting: Cardiovascular Disease

## 2020-08-13 ENCOUNTER — Ambulatory Visit (HOSPITAL_BASED_OUTPATIENT_CLINIC_OR_DEPARTMENT_OTHER): Payer: Medicare Other

## 2020-08-13 DIAGNOSIS — E079 Disorder of thyroid, unspecified: Secondary | ICD-10-CM | POA: Insufficient documentation

## 2020-08-13 DIAGNOSIS — I10 Essential (primary) hypertension: Secondary | ICD-10-CM | POA: Diagnosis not present

## 2020-08-13 DIAGNOSIS — Z20822 Contact with and (suspected) exposure to covid-19: Secondary | ICD-10-CM | POA: Insufficient documentation

## 2020-08-13 DIAGNOSIS — I4891 Unspecified atrial fibrillation: Secondary | ICD-10-CM

## 2020-08-13 DIAGNOSIS — Z01818 Encounter for other preprocedural examination: Secondary | ICD-10-CM | POA: Diagnosis not present

## 2020-08-13 LAB — ECHOCARDIOGRAM COMPLETE
MV M vel: 4.77 m/s
MV Peak grad: 90.9 mmHg
S' Lateral: 2.7 cm

## 2020-08-13 LAB — SARS CORONAVIRUS 2 (TAT 6-24 HRS): SARS Coronavirus 2: NEGATIVE

## 2020-08-15 ENCOUNTER — Encounter (HOSPITAL_COMMUNITY)
Admission: RE | Disposition: A | Discharge status: Home / Self Care | Payer: Self-pay | Source: Home / Self Care | Attending: Cardiovascular Disease

## 2020-08-15 ENCOUNTER — Ambulatory Visit (HOSPITAL_COMMUNITY)
Admission: RE | Admit: 2020-08-15 | Discharge: 2020-08-15 | Disposition: A | Discharge status: Home / Self Care | Payer: Medicare Other | Attending: Cardiovascular Disease | Admitting: Cardiovascular Disease

## 2020-08-15 ENCOUNTER — Other Ambulatory Visit: Payer: Self-pay

## 2020-08-15 ENCOUNTER — Ambulatory Visit (HOSPITAL_COMMUNITY): Payer: Medicare Other | Admitting: Certified Registered"

## 2020-08-15 ENCOUNTER — Encounter (HOSPITAL_COMMUNITY): Payer: Self-pay | Admitting: Cardiovascular Disease

## 2020-08-15 DIAGNOSIS — Z79899 Other long term (current) drug therapy: Secondary | ICD-10-CM | POA: Diagnosis not present

## 2020-08-15 DIAGNOSIS — I110 Hypertensive heart disease with heart failure: Secondary | ICD-10-CM | POA: Diagnosis not present

## 2020-08-15 DIAGNOSIS — I10 Essential (primary) hypertension: Secondary | ICD-10-CM | POA: Insufficient documentation

## 2020-08-15 DIAGNOSIS — I4891 Unspecified atrial fibrillation: Secondary | ICD-10-CM | POA: Insufficient documentation

## 2020-08-15 DIAGNOSIS — I4819 Other persistent atrial fibrillation: Secondary | ICD-10-CM | POA: Diagnosis not present

## 2020-08-15 DIAGNOSIS — I509 Heart failure, unspecified: Secondary | ICD-10-CM | POA: Diagnosis not present

## 2020-08-15 HISTORY — PX: CARDIOVERSION: SHX1299

## 2020-08-15 SURGERY — CARDIOVERSION
Anesthesia: General

## 2020-08-15 MED ORDER — PROPOFOL 10 MG/ML IV BOLUS
INTRAVENOUS | Status: DC | PRN
  Administered 2020-08-15: 50 mg via INTRAVENOUS

## 2020-08-15 MED ORDER — SODIUM CHLORIDE 0.9 % IV SOLN: INTRAVENOUS | Status: DC | PRN

## 2020-08-15 NOTE — Interval H&P Note (Signed)
History and Physical Interval Note:  08/15/2020 10:04 AM  Michelle Watkins  has presented today for surgery, with the diagnosis of A-FIB.  The various methods of treatment have been discussed with the patient and family. After consideration of risks, benefits and other options for treatment, the patient has consented to  Procedure(s): CARDIOVERSION (N/A) as a surgical intervention.  The patient's history has been reviewed, patient examined, no change in status, stable for surgery.  I have reviewed the patient's chart and labs.  Questions were answered to the patient's satisfaction.     Marcelia Petersen

## 2020-08-15 NOTE — Discharge Instructions (Signed)
Electrical Cardioversion Electrical cardioversion is the delivery of a jolt of electricity to restore a normal rhythm to the heart. A rhythm that is too fast or is not regular keeps the heart from pumping well. In this procedure, sticky patches or metal paddles are placed on the chest to deliver electricity to the heart from a device. This procedure may be done in an emergency if:  There is low or no blood pressure as a result of the heart rhythm.  Normal rhythm must be restored as fast as possible to protect the brain and heart from further damage.  It may save a life. This may also be a scheduled procedure for irregular or fast heart rhythms that are not immediately life-threatening. Tell a health care provider about:  Any allergies you have.  All medicines you are taking, including vitamins, herbs, eye drops, creams, and over-the-counter medicines.  Any problems you or family members have had with anesthetic medicines.  Any blood disorders you have.  Any surgeries you have had.  Any medical conditions you have.  Whether you are pregnant or may be pregnant. What are the risks? Generally, this is a safe procedure. However, problems may occur, including:  Allergic reactions to medicines.  A blood clot that breaks free and travels to other parts of your body.  The possible return of an abnormal heart rhythm within hours or days after the procedure.  Your heart stopping (cardiac arrest). This is rare. What happens before the procedure? Medicines  Your health care provider may have you start taking: ? Blood-thinning medicines (anticoagulants) so your blood does not clot as easily. ? Medicines to help stabilize your heart rate and rhythm.  Ask your health care provider about: ? Changing or stopping your regular medicines. This is especially important if you are taking diabetes medicines or blood thinners. ? Taking medicines such as aspirin and ibuprofen. These medicines can  thin your blood. Do not take these medicines unless your health care provider tells you to take them. ? Taking over-the-counter medicines, vitamins, herbs, and supplements. General instructions  Follow instructions from your health care provider about eating or drinking restrictions.  Plan to have someone take you home from the hospital or clinic.  If you will be going home right after the procedure, plan to have someone with you for 24 hours.  Ask your health care provider what steps will be taken to help prevent infection. These may include washing your skin with a germ-killing soap. What happens during the procedure?   An IV will be inserted into one of your veins.  Sticky patches (electrodes) or metal paddles may be placed on your chest.  You will be given a medicine to help you relax (sedative).  An electrical shock will be delivered. The procedure may vary among health care providers and hospitals. What can I expect after the procedure?  Your blood pressure, heart rate, breathing rate, and blood oxygen level will be monitored until you leave the hospital or clinic.  Your heart rhythm will be watched to make sure it does not change.  You may have some redness on the skin where the shocks were given. Follow these instructions at home:  Do not drive for 24 hours if you were given a sedative during your procedure.  Take over-the-counter and prescription medicines only as told by your health care provider.  Ask your health care provider how to check your pulse. Check it often.  Rest for 48 hours after the procedure or   as told by your health care provider.  Avoid or limit your caffeine use as told by your health care provider.  Keep all follow-up visits as told by your health care provider. This is important. Contact a health care provider if:  You feel like your heart is beating too quickly or your pulse is not regular.  You have a serious muscle cramp that does not go  away. Get help right away if:  You have discomfort in your chest.  You are dizzy or you feel faint.  You have trouble breathing or you are short of breath.  Your speech is slurred.  You have trouble moving an arm or leg on one side of your body.  Your fingers or toes turn cold or blue. Summary  Electrical cardioversion is the delivery of a jolt of electricity to restore a normal rhythm to the heart.  This procedure may be done right away in an emergency or may be a scheduled procedure if the condition is not an emergency.  Generally, this is a safe procedure.  After the procedure, check your pulse often as told by your health care provider. This information is not intended to replace advice given to you by your health care provider. Make sure you discuss any questions you have with your health care provider. Document Revised: 04/16/2019 Document Reviewed: 04/16/2019 Elsevier Patient Education  2020 [employer]  

## 2020-08-15 NOTE — Op Note (Signed)
Procedure: Electrical Cardioversion Indications:  Atrial Fibrillation  Procedure Details:  Consent: Risks of procedure as well as the alternatives and risks of each were explained to the (patient/caregiver).  Consent for procedure obtained.  Time Out: Verified patient identification, verified procedure, site/side was marked, verified correct patient position, special equipment/implants available, medications/allergies/relevent history reviewed, required imaging and test results available.  Performed  Patient placed on cardiac monitor, pulse oximetry, supplemental oxygen as necessary.  Sedation given: propofol 50 mg IV, Dr. Ermalene Postin Pacer pads placed anterior and posterior chest.  Cardioverted 1 time(s).  Cardioversion with synchronized biphasic 120J shock.  Evaluation: Findings: Post procedure EKG shows: NSR Complications: None Patient did tolerate procedure well.  Time Spent Directly with the Patient:  30 minutes   Michelle Watkins 08/15/2020, 10:06 AM

## 2020-08-15 NOTE — Anesthesia Preprocedure Evaluation (Signed)
Anesthesia Evaluation  Patient identified by MRN, date of birth, ID band Patient awake    Reviewed: Allergy & Precautions, NPO status , Patient's Chart, lab work & pertinent test results  History of Anesthesia Complications Negative for: history of anesthetic complications  Airway Mallampati: II  TM Distance: >3 FB Neck ROM: Full    Dental  (+) Dental Advisory Given, Teeth Intact   Pulmonary neg shortness of breath, neg sleep apnea, neg COPD, neg recent URI,  Covid-19 Nucleic Acid Test Results Lab Results      Component                Value               Date                      SARSCOV2NAA              NEGATIVE            08/13/2020              breath sounds clear to auscultation       Cardiovascular hypertension, Pt. on medications and Pt. on home beta blockers (-) angina+CHF  + dysrhythmias Atrial Fibrillation  Rhythm:Irregular  1. Left ventricular ejection fraction, by estimation, is 30 to 35%. The  left ventricle has moderately decreased function. The left ventricle  demonstrates global hypokinesis. Left ventricular diastolic function could  not be evaluated.  2. Right ventricular systolic function is low normal. The right  ventricular size is normal. There is mildly elevated pulmonary artery  systolic pressure. The estimated right ventricular systolic pressure is  60.7 mmHg.  3. Right atrial size was mildly dilated.  4. The mitral valve is abnormal. Moderate mitral valve regurgitation.  5. The aortic valve is tricuspid. Aortic valve regurgitation is not  visualized.  6. The inferior vena cava is normal in size with <50% respiratory  variability, suggesting right atrial pressure of 8 mmHg.    Neuro/Psych negative neurological ROS  negative psych ROS   GI/Hepatic negative GI ROS, Neg liver ROS,   Endo/Other  negative endocrine ROS  Renal/GU Lab Results      Component                Value               Date                       CREATININE               0.90                07/18/2020                Musculoskeletal   Abdominal   Peds  Hematology Lab Results      Component                Value               Date                      WBC                      8.7                 07/18/2020  HGB                      13.8                07/18/2020                HCT                      43.0                07/18/2020                MCV                      88                  07/18/2020                PLT                      319                 07/18/2020            eliquis for afib   Anesthesia Other Findings   Reproductive/Obstetrics                             Anesthesia Physical Anesthesia Plan  ASA: III  Anesthesia Plan: General   Post-op Pain Management:    Induction: Intravenous  PONV Risk Score and Plan: 3  Airway Management Planned: Mask  Additional Equipment: None  Intra-op Plan:   Post-operative Plan:   Informed Consent: I have reviewed the patients History and Physical, chart, labs and discussed the procedure including the risks, benefits and alternatives for the proposed anesthesia with the patient or authorized representative who has indicated his/her understanding and acceptance.     Dental advisory given  Plan Discussed with: CRNA and Surgeon  Anesthesia Plan Comments:         Anesthesia Quick Evaluation

## 2020-08-15 NOTE — Transfer of Care (Signed)
Immediate Anesthesia Transfer of Care Note  Patient: Michelle Watkins  Procedure(s) Performed: CARDIOVERSION (N/A )  Patient Location: Endoscopy Unit  Anesthesia Type:General  Level of Consciousness: drowsy  Airway & Oxygen Therapy: Patient Spontanous Breathing  Post-op Assessment: Report given to RN and Post -op Vital signs reviewed and stable  Post vital signs: Reviewed and stable  Last Vitals:  Vitals Value Taken Time  BP 155/114 08/15/20 0951  Temp 36.6 C 08/15/20 0951  Pulse 116 08/15/20 0951  Resp 17 08/15/20 0951  SpO2 99 % 08/15/20 0951    Last Pain:  Vitals:   08/15/20 0951  TempSrc: Oral  PainSc: 0-No pain         Complications: No complications documented.

## 2020-08-15 NOTE — Interval H&P Note (Signed)
History and Physical Interval Note:  08/15/2020 9:55 AM  Michelle Watkins  has presented today for surgery, with the diagnosis of A-FIB.  The various methods of treatment have been discussed with the patient and family. After consideration of risks, benefits and other options for treatment, the patient has consented to  Procedure(s): CARDIOVERSION (N/A) as a surgical intervention.  The patient's history has been reviewed, patient examined, no change in status, stable for surgery.  I have reviewed the patient's chart and labs.  Questions were answered to the patient's satisfaction.     Michelle Watkins

## 2020-08-15 NOTE — Anesthesia Procedure Notes (Signed)
Procedure Name: General with mask airway Date/Time: 08/15/2020 10:05 AM Performed by: Imagene Riches, CRNA Pre-anesthesia Checklist: Patient identified, Emergency Drugs available, Suction available, Patient being monitored and Timeout performed Patient Re-evaluated:Patient Re-evaluated prior to induction Oxygen Delivery Method: Ambu bag

## 2020-08-17 ENCOUNTER — Encounter (HOSPITAL_COMMUNITY): Payer: Self-pay | Admitting: Cardiovascular Disease

## 2020-08-17 NOTE — Anesthesia Postprocedure Evaluation (Signed)
Anesthesia Post Note  Patient: Julianna Vanwagner  Procedure(s) Performed: CARDIOVERSION (N/A )     Patient location during evaluation: Endoscopy Anesthesia Type: General Level of consciousness: awake and alert Pain management: pain level controlled Vital Signs Assessment: post-procedure vital signs reviewed and stable Respiratory status: spontaneous breathing, nonlabored ventilation, respiratory function stable and patient connected to nasal cannula oxygen Cardiovascular status: blood pressure returned to baseline and stable Postop Assessment: no apparent nausea or vomiting Anesthetic complications: no   No complications documented.  Last Vitals:  Vitals:   08/15/20 1025 08/15/20 1030  BP: 109/70 107/71  Pulse: (!) 50 (!) 50  Resp: 16 17  Temp:    SpO2: 97% 97%    Last Pain:  Vitals:   08/15/20 1025  TempSrc:   PainSc: 0-No pain                 Adine Heimann

## 2020-08-26 ENCOUNTER — Encounter (HOSPITAL_COMMUNITY): Payer: Self-pay | Admitting: Nurse Practitioner

## 2020-08-26 ENCOUNTER — Other Ambulatory Visit: Payer: Self-pay

## 2020-08-26 ENCOUNTER — Ambulatory Visit (HOSPITAL_COMMUNITY)
Admission: RE | Admit: 2020-08-26 | Discharge: 2020-08-26 | Disposition: A | Discharge status: Home / Self Care | Payer: Medicare Other | Source: Ambulatory Visit | Attending: Nurse Practitioner | Admitting: Nurse Practitioner

## 2020-08-26 VITALS — BP 124/90 | HR 122 | Ht 63.0 in | Wt 214.8 lb

## 2020-08-26 DIAGNOSIS — Z79899 Other long term (current) drug therapy: Secondary | ICD-10-CM | POA: Insufficient documentation

## 2020-08-26 DIAGNOSIS — D6869 Other thrombophilia: Secondary | ICD-10-CM | POA: Diagnosis not present

## 2020-08-26 DIAGNOSIS — Z7901 Long term (current) use of anticoagulants: Secondary | ICD-10-CM | POA: Insufficient documentation

## 2020-08-26 DIAGNOSIS — I4819 Other persistent atrial fibrillation: Secondary | ICD-10-CM | POA: Diagnosis not present

## 2020-08-26 DIAGNOSIS — I4891 Unspecified atrial fibrillation: Secondary | ICD-10-CM | POA: Diagnosis not present

## 2020-08-26 MED ORDER — METOPROLOL TARTRATE 25 MG PO TABS: 37.5000 mg | ORAL_TABLET | Freq: Two times a day (BID) | ORAL | 3 refills | Status: DC

## 2020-08-26 NOTE — Progress Notes (Signed)
Primary Care Physician: Orpah Melter, MD Referring Physician: Dr. Tenna Child Michelle Watkins is a 74 y.o. female with a h/o new onset afib found at a MD's appointment early in October. She was seen by Dr. Burt Knack started on rate control and eliquis for a CHA2DS2VASc score of 3. She was then set up for cardioversion which was successful 11/19. She  did well for about a week, until she found Sunday her brother and his wife were divorcing after 54 years of marriage. She could tell she went back into afib with that news. EKG shows afib with RVR  in the 120's. 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 Burt Knack. She appears to be tolerating the afib well.   Today, she denies symptoms of palpitations, chest pain, shortness of breath, orthopnea, PND, lower extremity edema, dizziness, presyncope, syncope, or neurologic sequela. The patient is tolerating medications without difficulties and is otherwise without complaint today.   Past Medical History:  Diagnosis Date  . Hypertension   . Thyroid disease    Past Surgical History:  Procedure Laterality Date  . APPENDECTOMY    . CARDIOVERSION N/A 08/15/2020   Procedure: CARDIOVERSION;  Surgeon: Sanda Klein, MD;  Location: [facility];  Service: Cardiovascular;  Laterality: N/A;  . HYSTERECTOMY ABDOMINAL WITH SALPINGECTOMY      Current Outpatient Medications  Medication Sig Dispense Refill  . acetaminophen (TYLENOL) 650 MG CR tablet Take 650 mg by mouth every 8 (eight) hours as needed (pain.).     Marland Kitchen amLODipine (NORVASC) 2.5 MG tablet Take 2.5 mg by mouth daily after lunch.     Marland Kitchen apixaban (ELIQUIS) 5 MG TABS tablet Take 1 tablet (5 mg total) by mouth 2 (two) times daily. 180 tablet 3  . levothyroxine (SYNTHROID) 125 MCG tablet Take 125 mcg by mouth daily before breakfast.    . loratadine (CLARITIN) 10 MG tablet Take 10 mg by mouth daily as needed for allergies.    . metoprolol tartrate (LOPRESSOR) 25 MG tablet Take 1.5 tablets  (37.5 mg total) by mouth 2 (two) times daily. 270 tablet 3   No current facility-administered medications for this encounter.    Allergies  Allergen Reactions  . Penicillins Rash  . Sulfamethoxazole Rash    Social History   Socioeconomic History  . Marital status: Married    Spouse name: Not on file  . Number of children: Not on file  . Years of education: Not on file  . Highest education level: Not on file  Occupational History  . Not on file  Tobacco Use  . Smoking status: Never Smoker  . Smokeless tobacco: Never Used  Substance and Sexual Activity  . Alcohol use: Never  . Drug use: Never  . Sexual activity: Not on file  Other Topics Concern  . Not on file  Social History Narrative  . Not on file   Social Determinants of Health   Financial Resource Strain:   . Difficulty of Paying Living Expenses: Not on file  Food Insecurity:   . Worried About Charity fundraiser in the Last Year: Not on file  . Ran Out of Food in the Last Year: Not on file  Transportation Needs:   . Lack of Transportation (Medical): Not on file  . Lack of Transportation (Non-Medical): Not on file  Physical Activity:   . Days of Exercise per Week: Not on file  . Minutes of Exercise per Session: Not on file  Stress:   . Feeling of Stress : Not on file  Social Connections:   . Frequency of Communication with Friends and Family: Not on file  . Frequency of Social Gatherings with Friends and Family: Not on file  . Attends Religious Services: Not on file  . Active Member of Clubs or Organizations: Not on file  . Attends Archivist Meetings: Not on file  . Marital Status: Not on file  Intimate Partner Violence:   . Fear of Current or Ex-Partner: Not on file  . Emotionally Abused: Not on file  . Physically Abused: Not on file  . Sexually Abused: Not on file    No family history on file.  ROS- All systems are reviewed and negative except as per the HPI above  Physical  Exam: Vitals:   08/26/20 1029  BP: 124/90  Pulse: (!) 122  Weight: 97.4 kg  Height: 5\' 3"  (1.6 m)   Wt Readings from Last 3 Encounters:  08/26/20 97.4 kg  08/15/20 98 kg  07/18/20 98 kg    Labs: Lab Results  Component Value Date   NA 139 07/18/2020   K 4.8 07/18/2020   CL 103 07/18/2020   CO2 23 07/18/2020   GLUCOSE 91 07/18/2020   BUN 18 07/18/2020   CREATININE 0.90 07/18/2020   CALCIUM 9.5 07/18/2020   No results found for: INR No results found for: CHOL, HDL, LDLCALC, TRIG   GEN- The patient is well appearing, alert and oriented x 3 today.   Head- normocephalic, atraumatic Eyes-  Sclera clear, conjunctiva pink Ears- hearing intact Oropharynx- clear Neck- supple, no JVP Lymph- no cervical lymphadenopathy Lungs- Clear to ausculation bilaterally, normal work of breathing Heart- irregular rate and rhythm, no murmurs, rubs or gallops, PMI not laterally displaced GI- soft, NT, ND, + BS Extremities- no clubbing, cyanosis, or edema MS- no significant deformity or atrophy Skin- no rash or lesion Psych- euthymic mood, full affect Neuro- strength and sensation are intact  EKG-afib at 122 bpm, qrs int 68 ms, qtc 470 ms   Echo-1. Left ventricular ejection fraction, by estimation, is 30 to 35%. The  left ventricle has moderately decreased function. The left ventricle  demonstrates global hypokinesis. Left ventricular diastolic function could  not be evaluated.  2. Right ventricular systolic function is low normal. The right  ventricular size is normal. There is mildly elevated pulmonary artery  systolic pressure. The estimated right ventricular systolic pressure is  63.0 mmHg.  3. Right atrial size was mildly dilated.  4. The mitral valve is abnormal. Moderate mitral valve regurgitation.  5. The aortic valve is tricuspid. Aortic valve regurgitation is not  visualized.  6. The inferior vena cava is normal in size with <50% respiratory  variability, suggesting  right atrial pressure of 8 mmHg.   Assessment and Plan: 1. Persistent afib Dx in October 2021 Successful cardioversion but with ERAF Discussed antiarrythmic's, her options are amiodarone or tikosyn due to EF of 30-35% She would like more time to think about it I will increase metoprolol to 37.5 mg bid for better rate control  I will see back on Friday and then will discuss with pt her options again Continue  eliquis 5 mg bid for a CHA2DS2VASc score of 3  Mark Hassey C. Aminat [place], [facility] [address] [place], [place] [address] [phone]

## 2020-08-26 NOTE — Patient Instructions (Signed)
Increase metoprolol to 1 and 1/2 tablets twice a day (37.5mg  twice a day)

## 2020-08-29 ENCOUNTER — Encounter (HOSPITAL_COMMUNITY): Payer: Self-pay | Admitting: Nurse Practitioner

## 2020-08-29 ENCOUNTER — Ambulatory Visit (HOSPITAL_COMMUNITY)
Admission: RE | Admit: 2020-08-29 | Discharge: 2020-08-29 | Disposition: A | Discharge status: Home / Self Care | Payer: Medicare Other | Source: Ambulatory Visit | Attending: Nurse Practitioner | Admitting: Nurse Practitioner

## 2020-08-29 ENCOUNTER — Other Ambulatory Visit: Payer: Self-pay

## 2020-08-29 VITALS — BP 142/98 | HR 106 | Ht 63.0 in | Wt 215.0 lb

## 2020-08-29 DIAGNOSIS — Z79899 Other long term (current) drug therapy: Secondary | ICD-10-CM | POA: Diagnosis not present

## 2020-08-29 DIAGNOSIS — I4819 Other persistent atrial fibrillation: Secondary | ICD-10-CM | POA: Insufficient documentation

## 2020-08-29 DIAGNOSIS — D6869 Other thrombophilia: Secondary | ICD-10-CM

## 2020-08-29 DIAGNOSIS — Z7901 Long term (current) use of anticoagulants: Secondary | ICD-10-CM | POA: Insufficient documentation

## 2020-08-29 DIAGNOSIS — I4891 Unspecified atrial fibrillation: Secondary | ICD-10-CM

## 2020-08-29 NOTE — Progress Notes (Addendum)
Primary Care Physician: Orpah Melter, MD Referring Physician: Dr. Burt Knack  Endocrinologist: Dr. Elyse Hsu    Michelle Watkins is a 74 y.o. female with a h/o new onset afib found at a MD's appointment early in October. She was seen by Dr. Burt Knack started on rate control and eliquis for a CHA2DS2VASc score of 3. She was then set up for cardioversion which was successful 11/19. She  did well for about a week, until she found Sunday her brother and his wife were divorcing after 60 years of marriage. She could tell she went back into afib with that news. EKG shows afib with RVR  in the 120's. 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 Burt Knack. She appears to be tolerating the afib well.   F/u in afib clinic, 08/29/20. She is better controlled with increase of BB. She still has questions re start of amiodarone vrs tikosyn to restore SR, but she is leaning toward amiodarone. She  does not want to make changes prior to her trip to [place] 12/17-20th. She would also be more comfortable fi I will let her Thyroid MD know re potential amio use and see if he has any concerns starting this drug.if amio is used I would like to see it as a bridge to ablation in 3-6 months.   Today, she denies symptoms of palpitations, chest pain, shortness of breath, orthopnea, PND, lower extremity edema, dizziness, presyncope, syncope, or neurologic sequela. The patient is tolerating medications without difficulties and is otherwise without complaint today.   Past Medical History:  Diagnosis Date  . Hypertension   . Thyroid disease    Past Surgical History:  Procedure Laterality Date  . APPENDECTOMY    . CARDIOVERSION N/A 08/15/2020   Procedure: CARDIOVERSION;  Surgeon: Sanda Klein, MD;  Location: [facility];  Service: Cardiovascular;  Laterality: N/A;  . HYSTERECTOMY ABDOMINAL WITH SALPINGECTOMY      Current Outpatient Medications  Medication Sig Dispense Refill  . acetaminophen (TYLENOL) 650  MG CR tablet Take 650 mg by mouth every 8 (eight) hours as needed (pain.).     Marland Kitchen amLODipine (NORVASC) 2.5 MG tablet Take 2.5 mg by mouth daily after lunch.     Marland Kitchen apixaban (ELIQUIS) 5 MG TABS tablet Take 1 tablet (5 mg total) by mouth 2 (two) times daily. 180 tablet 3  . levothyroxine (SYNTHROID) 125 MCG tablet Take 125 mcg by mouth daily before breakfast.    . loratadine (CLARITIN) 10 MG tablet Take 10 mg by mouth daily as needed for allergies.    . metoprolol tartrate (LOPRESSOR) 25 MG tablet Take 1.5 tablets (37.5 mg total) by mouth 2 (two) times daily. 270 tablet 3   No current facility-administered medications for this encounter.    Allergies  Allergen Reactions  . Penicillins Rash  . Sulfamethoxazole Rash    Social History   Socioeconomic History  . Marital status: Married    Spouse name: Not on file  . Number of children: Not on file  . Years of education: Not on file  . Highest education level: Not on file  Occupational History  . Not on file  Tobacco Use  . Smoking status: Never Smoker  . Smokeless tobacco: Never Used  Substance and Sexual Activity  . Alcohol use: Never  . Drug use: Never  . Sexual activity: Not on file  Other Topics Concern  . Not on file  Social History Narrative  . Not on file   Social  Determinants of Health   Financial Resource Strain:   . Difficulty of Paying Living Expenses: Not on file  Food Insecurity:   . Worried About Charity fundraiser in the Last Year: Not on file  . Ran Out of Food in the Last Year: Not on file  Transportation Needs:   . Lack of Transportation (Medical): Not on file  . Lack of Transportation (Non-Medical): Not on file  Physical Activity:   . Days of Exercise per Week: Not on file  . Minutes of Exercise per Session: Not on file  Stress:   . Feeling of Stress : Not on file  Social Connections:   . Frequency of Communication with Friends and Family: Not on file  . Frequency of Social Gatherings with Friends  and Family: Not on file  . Attends Religious Services: Not on file  . Active Member of Clubs or Organizations: Not on file  . Attends Archivist Meetings: Not on file  . Marital Status: Not on file  Intimate Partner Violence:   . Fear of Current or Ex-Partner: Not on file  . Emotionally Abused: Not on file  . Physically Abused: Not on file  . Sexually Abused: Not on file    No family history on file.  ROS- All systems are reviewed and negative except as per the HPI above  Physical Exam: Vitals:   08/29/20 1126  BP: (!) 142/98  Pulse: (!) 106  Weight: 97.5 kg  Height: 5\' 3"  (1.6 m)   Wt Readings from Last 3 Encounters:  08/29/20 97.5 kg  08/26/20 97.4 kg  08/15/20 98 kg    Labs: Lab Results  Component Value Date   NA 139 07/18/2020   K 4.8 07/18/2020   CL 103 07/18/2020   CO2 23 07/18/2020   GLUCOSE 91 07/18/2020   BUN 18 07/18/2020   CREATININE 0.90 07/18/2020   CALCIUM 9.5 07/18/2020   No results found for: INR No results found for: CHOL, HDL, LDLCALC, TRIG   GEN- The patient is well appearing, alert and oriented x 3 today.   Head- normocephalic, atraumatic Eyes-  Sclera clear, conjunctiva pink Ears- hearing intact Oropharynx- clear Neck- supple, no JVP Lymph- no cervical lymphadenopathy Lungs- Clear to ausculation bilaterally, normal work of breathing Heart- irregular rate and rhythm, no murmurs, rubs or gallops, PMI not laterally displaced GI- soft, NT, ND, + BS Extremities- no clubbing, cyanosis, or edema MS- no significant deformity or atrophy Skin- no rash or lesion Psych- euthymic mood, full affect Neuro- strength and sensation are intact  EKG-afib at 106  bpm, qrs int 68ms, qtc 4446 ms   Echo-1. Left ventricular ejection fraction, by estimation, is 30 to 35%. The  left ventricle has moderately decreased function. The left ventricle  demonstrates global hypokinesis. Left ventricular diastolic function could  not be evaluated.  2.  Right ventricular systolic function is low normal. The right  ventricular size is normal. There is mildly elevated pulmonary artery  systolic pressure. The estimated right ventricular systolic pressure is  63.8 mmHg.  3. Right atrial size was mildly dilated.  4. The mitral valve is abnormal. Moderate mitral valve regurgitation.  5. The aortic valve is tricuspid. Aortic valve regurgitation is not  visualized.  6. The inferior vena cava is normal in size with <50% respiratory  variability, suggesting right atrial pressure of 8 mmHg.   Assessment and Plan: 1. Persistent afib Dx in October 2021 Successful cardioversion but with ERAF Discussed antiarrythmic's, her options  are amiodarone or tikosyn due to EF of 30-35%  she is leaning toward amio as a bridge to ablation I will contact her endocrinologist to see if any concerns with her thyroid history  Continue metoprolol to 37.5 mg bid for better rate control  I will get back to pt after I hear back from  Dr. Elyse Hsu I also discussed front line ablation with pt and she would like to try drug first  Continue  eliquis 5 mg bid for a CHA2DS2VASc score of 3   Addendum- 09/05/20- I received written communication from Dr. Elyse Hsu that he would be OK to start amiodarone with close supervision of thyroid labs every 8-12 weeks. I discussed with pt and she would like to wait until after the holidays to start drug. She will contact us when ready. I anticipate starting at 200 mg bid with reduction of metoprolol to 25 mg bid, ekg after one week, office visit 2 weeks after that with cardioversion scheduled at that time.   Geroge Baseman Rosan Calbert, [facility] [address] [place], [place] [address] [phone]

## 2020-09-05 ENCOUNTER — Telehealth (HOSPITAL_COMMUNITY): Payer: Self-pay | Admitting: Nurse Practitioner

## 2020-09-05 NOTE — Addendum Note (Signed)
Encounter addended by: Sherran Needs, NP on: 09/05/2020 1:56 PM  Actions taken: Clinical Note Signed

## 2020-09-20 DIAGNOSIS — I4819 Other persistent atrial fibrillation: Secondary | ICD-10-CM | POA: Diagnosis not present

## 2020-09-20 DIAGNOSIS — I499 Cardiac arrhythmia, unspecified: Secondary | ICD-10-CM | POA: Diagnosis not present

## 2020-09-20 DIAGNOSIS — Z20822 Contact with and (suspected) exposure to covid-19: Secondary | ICD-10-CM | POA: Diagnosis not present

## 2020-09-20 DIAGNOSIS — I10 Essential (primary) hypertension: Secondary | ICD-10-CM | POA: Diagnosis not present

## 2020-09-20 DIAGNOSIS — I4891 Unspecified atrial fibrillation: Secondary | ICD-10-CM | POA: Diagnosis not present

## 2020-09-20 DIAGNOSIS — Z7901 Long term (current) use of anticoagulants: Secondary | ICD-10-CM | POA: Diagnosis not present

## 2020-09-20 DIAGNOSIS — R918 Other nonspecific abnormal finding of lung field: Secondary | ICD-10-CM | POA: Diagnosis not present

## 2020-09-20 DIAGNOSIS — I482 Chronic atrial fibrillation, unspecified: Secondary | ICD-10-CM | POA: Diagnosis not present

## 2020-09-20 DIAGNOSIS — Z66 Do not resuscitate: Secondary | ICD-10-CM | POA: Diagnosis not present

## 2020-09-20 DIAGNOSIS — R079 Chest pain, unspecified: Secondary | ICD-10-CM | POA: Diagnosis not present

## 2020-09-20 DIAGNOSIS — Z90710 Acquired absence of both cervix and uterus: Secondary | ICD-10-CM | POA: Diagnosis not present

## 2020-09-20 DIAGNOSIS — Z6837 Body mass index (BMI) 37.0-37.9, adult: Secondary | ICD-10-CM | POA: Diagnosis not present

## 2020-09-20 DIAGNOSIS — E039 Hypothyroidism, unspecified: Secondary | ICD-10-CM | POA: Diagnosis not present

## 2020-09-20 DIAGNOSIS — Z880 Allergy status to penicillin: Secondary | ICD-10-CM | POA: Diagnosis not present

## 2020-09-22 DIAGNOSIS — Z880 Allergy status to penicillin: Secondary | ICD-10-CM | POA: Diagnosis not present

## 2020-09-22 DIAGNOSIS — Z90710 Acquired absence of both cervix and uterus: Secondary | ICD-10-CM | POA: Diagnosis not present

## 2020-09-22 DIAGNOSIS — I10 Essential (primary) hypertension: Secondary | ICD-10-CM | POA: Diagnosis present

## 2020-09-22 DIAGNOSIS — Z7901 Long term (current) use of anticoagulants: Secondary | ICD-10-CM | POA: Diagnosis not present

## 2020-09-22 DIAGNOSIS — E039 Hypothyroidism, unspecified: Secondary | ICD-10-CM | POA: Diagnosis present

## 2020-09-22 DIAGNOSIS — I4819 Other persistent atrial fibrillation: Secondary | ICD-10-CM | POA: Diagnosis not present

## 2020-09-22 DIAGNOSIS — Z6837 Body mass index (BMI) 37.0-37.9, adult: Secondary | ICD-10-CM | POA: Diagnosis not present

## 2020-09-22 DIAGNOSIS — Z20822 Contact with and (suspected) exposure to covid-19: Secondary | ICD-10-CM | POA: Diagnosis present

## 2020-09-22 DIAGNOSIS — Z66 Do not resuscitate: Secondary | ICD-10-CM | POA: Diagnosis present

## 2020-09-23 DIAGNOSIS — I4819 Other persistent atrial fibrillation: Secondary | ICD-10-CM | POA: Diagnosis not present

## 2020-09-24 ENCOUNTER — Telehealth (HOSPITAL_COMMUNITY): Payer: Self-pay | Admitting: *Deleted

## 2020-09-24 MED ORDER — FUROSEMIDE 20 MG PO TABS: ORAL_TABLET | ORAL | 0 refills | Status: DC

## 2020-09-24 MED ORDER — METOPROLOL TARTRATE 25 MG PO TABS: 25.0000 mg | ORAL_TABLET | Freq: Two times a day (BID) | ORAL | 3 refills | Status: DC

## 2020-09-24 NOTE — Telephone Encounter (Signed)
Patient called in stating she was at [facility] for afib for the last 3 days. They loaded her on amiodarone and cardioverted her. She was discharged yesterday afternoon. Last night she had orthopnea and dizziness. HR 63. Discussed with Rudi Coco NP will decrease metoprolol back to 25mg  BID and start lasix 20mg  once a day for the next 3 days. Will see her tomorrow after she returns to home from .

## 2020-09-25 ENCOUNTER — Encounter (HOSPITAL_COMMUNITY): Payer: Self-pay | Admitting: Nurse Practitioner

## 2020-09-25 ENCOUNTER — Other Ambulatory Visit: Payer: Self-pay

## 2020-09-25 ENCOUNTER — Ambulatory Visit (HOSPITAL_COMMUNITY)
Admission: RE | Admit: 2020-09-25 | Discharge: 2020-09-25 | Disposition: A | Discharge status: Home / Self Care | Payer: Medicare Other | Source: Ambulatory Visit | Attending: Nurse Practitioner | Admitting: Nurse Practitioner

## 2020-09-25 VITALS — BP 120/82 | HR 60 | Ht 63.0 in | Wt 218.4 lb

## 2020-09-25 DIAGNOSIS — I4891 Unspecified atrial fibrillation: Secondary | ICD-10-CM | POA: Diagnosis not present

## 2020-09-25 DIAGNOSIS — Z7901 Long term (current) use of anticoagulants: Secondary | ICD-10-CM | POA: Diagnosis not present

## 2020-09-25 DIAGNOSIS — I4819 Other persistent atrial fibrillation: Secondary | ICD-10-CM | POA: Diagnosis not present

## 2020-09-25 DIAGNOSIS — D6869 Other thrombophilia: Secondary | ICD-10-CM

## 2020-09-25 NOTE — Patient Instructions (Signed)
Decrease metoprolol to 1/2 tablet twice a day (12.5mg  twice a day)  Increase lasix to 30mg  daily for the next 2 days then stop unless still feeling short of breath can do a 3rd day.  Call on Monday with update of symptoms. [phone]

## 2020-09-25 NOTE — Progress Notes (Signed)
Primary Care Physician: Orpah Melter, MD Referring Physician: Dr. Burt Knack  Endocrinologist: Dr. Elyse Hsu    Michelle Watkins is a 74 y.o. female with a h/o new onset afib found at a MD's appointment early in October. She was seen by Dr. Burt Knack started on rate control and eliquis for a CHA2DS2VASc score of 3. She was then set up for cardioversion which was successful 11/19. She  did well for about a week, until she found Sunday her brother and his wife were divorcing after 54 years of marriage. She could tell she went back into afib with that news. EKG shows afib with RVR  in the 120's. 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 Burt Knack. She appears to be tolerating the afib well.   F/u in afib clinic, 08/29/20. She is better controlled with increase of BB. She still has questions re start of amiodarone vrs tikosyn to restore SR, but she is leaning toward amiodarone. She  does not want to make changes prior to her trip to [place] 12/17-20th. She would also be more comfortable fi I will let her Thyroid MD know re potential amio use and see if he has any concerns starting this drug.if amio is used I would like to see it as a bridge to ablation in 3-6 months.   F/u in afib clinic 09/25/20. Pt  had been in  [place] and ended up in the hospital with afib with RVR Christmas Day. She was there 3 days. They were eating out alot  and she feels got more salt than  usual.  She was loaded on amiodarone and successfully  cardioverted. She  was d/c on amio load, 200 mg bid . She called the office yesterday as she felt weak since D/c and reported that she was taking amio 200 mg bid and metoprolol 50 mg bid, with HR of 63 bpm,  and was told to decrease BB to 25 mg bid and appointment was given for today. She still says that she can not lay flat to sleep. Some HR's at home in the upper 40's.  We also  started lasix 20 mg yesterday for the orthopnea. She feels a little better today. Pulse OX has  been in the upper 90's. She  has been staying in [place].   Today, she denies symptoms of palpitations, chest pain, shortness of breath, orthopnea, PND, lower extremity edema, dizziness, presyncope, syncope, or neurologic sequela. The patient is tolerating medications without difficulties and is otherwise without complaint today.   Past Medical History:  Diagnosis Date  . Hypertension   . Thyroid disease    Past Surgical History:  Procedure Laterality Date  . APPENDECTOMY    . CARDIOVERSION N/A 08/15/2020   Procedure: CARDIOVERSION;  Surgeon: Sanda Klein, MD;  Location: [facility];  Service: Cardiovascular;  Laterality: N/A;  . HYSTERECTOMY ABDOMINAL WITH SALPINGECTOMY      Current Outpatient Medications  Medication Sig Dispense Refill  . acetaminophen (TYLENOL) 650 MG CR tablet Take 650 mg by mouth every 8 (eight) hours as needed (pain.).     Marland Kitchen amiodarone (PACERONE) 200 MG tablet Take by mouth 2 (two) times daily.    Marland Kitchen amLODipine (NORVASC) 2.5 MG tablet Take 2.5 mg by mouth daily after lunch.     Marland Kitchen apixaban (ELIQUIS) 5 MG TABS tablet Take 1 tablet (5 mg total) by mouth 2 (two) times daily. 180 tablet 3  . furosemide (LASIX) 20 MG tablet Take 1 tablet by  mouth daily for 3 days then only as needed for fluid retention 10 tablet 0  . levothyroxine (SYNTHROID) 125 MCG tablet Take 125 mcg by mouth daily before breakfast.    . loratadine (CLARITIN) 10 MG tablet Take 10 mg by mouth daily as needed for allergies.    . metoprolol tartrate (LOPRESSOR) 25 MG tablet Take 1 tablet (25 mg total) by mouth 2 (two) times daily. 270 tablet 3   No current facility-administered medications for this encounter.    Allergies  Allergen Reactions  . Penicillins Rash  . Sulfamethoxazole Rash    Social History   Socioeconomic History  . Marital status: Married    Spouse name: Not on file  . Number of children: Not on file  . Years of education: Not on file  . Highest education level: Not on file   Occupational History  . Not on file  Tobacco Use  . Smoking status: Never Smoker  . Smokeless tobacco: Never Used  Substance and Sexual Activity  . Alcohol use: Never  . Drug use: Never  . Sexual activity: Not on file  Other Topics Concern  . Not on file  Social History Narrative  . Not on file   Social Determinants of Health   Financial Resource Strain: Not on file  Food Insecurity: Not on file  Transportation Needs: Not on file  Physical Activity: Not on file  Stress: Not on file  Social Connections: Not on file  Intimate Partner Violence: Not on file    No family history on file.  ROS- All systems are reviewed and negative except as per the HPI above  Physical Exam: There were no vitals filed for this visit. Wt Readings from Last 3 Encounters:  08/29/20 97.5 kg  08/26/20 97.4 kg  08/15/20 98 kg    Labs: Lab Results  Component Value Date   NA 139 07/18/2020   K 4.8 07/18/2020   CL 103 07/18/2020   CO2 23 07/18/2020   GLUCOSE 91 07/18/2020   BUN 18 07/18/2020   CREATININE 0.90 07/18/2020   CALCIUM 9.5 07/18/2020   No results found for: INR No results found for: CHOL, HDL, LDLCALC, TRIG   GEN- The patient is well appearing, alert and oriented x 3 today.   Head- normocephalic, atraumatic Eyes-  Sclera clear, conjunctiva pink Ears- hearing intact Oropharynx- clear Neck- supple, no JVP Lymph- no cervical lymphadenopathy Lungs- Clear to ausculation bilaterally, normal work of breathing Heart-  regular rate and rhythm, no murmurs, rubs or gallops, PMI not laterally displaced GI- soft, NT, ND, + BS Extremities- no clubbing, cyanosis, or edema MS- no significant deformity or atrophy Skin- no rash or lesion Psych- euthymic mood, full affect Neuro- strength and sensation are intact  EKG-NSR at 60 bpm, pr int 194 ms, qrs int 90 ms, qtc 494 ms  Pulse ox 98%  Echo-1. Left ventricular ejection fraction, by estimation, is 30 to 35%. The  left ventricle has  moderately decreased function. The left ventricle  demonstrates global hypokinesis. Left ventricular diastolic function could  not be evaluated.  2. Right ventricular systolic function is low normal. The right  ventricular size is normal. There is mildly elevated pulmonary artery  systolic pressure. The estimated right ventricular systolic pressure is  39.6 mmHg.  3. Right atrial size was mildly dilated.  4. The mitral valve is abnormal. Moderate mitral valve regurgitation.  5. The aortic valve is tricuspid. Aortic valve regurgitation is not  visualized.  6. The inferior vena  cava is normal in size with <50% respiratory  variability, suggesting right atrial pressure of 8 mmHg.   Assessment and Plan: 1. Persistent afib Dx in October 2021 Successful cardioversion but with ERAF We had planned on loading on amiodarone after the holidays as she was going out of town She  unfortunately had afib with RVR and presented to local hospital in [place] that transferred her to [place]  There she  was given IV amiodarone and successfully cardioverted   She is now staying in SR on amiodarone 200 mg bid and metoprolol will be reduced to 12.5 mg bid  Now c/o of orthopnea and some weakness Weight is up around 5 lbs and I will increase lasix  to 30 mg qd for the next 2-3 days Hopefully with this and the reduction in BB, she will feel better, if not over the holiday weekend, reduce amio to 200 mg qd and then call us on Monday with how she feels I will see back in 2 weeks but sooner if needed   Continue  eliquis 5 mg bid for a CHA2DS2VASc score of 3 Check tsh on f/u visit in 2 weeks as she is on thyroid replacement    Michelle Watkins, [facility] [address] [place], [place] [address] [phone]

## 2020-10-03 ENCOUNTER — Telehealth (HOSPITAL_COMMUNITY): Payer: Self-pay | Admitting: *Deleted

## 2020-10-03 NOTE — Telephone Encounter (Signed)
Patient called in with BP spikes starting last PM. BP was 200/97 she took an extra 1/2 of metoprolol since it had recently been cut this did reduce her BP to 146/61 but HR in the 50s.  (prior to starting metoprolol pt was on lisinopril 40mg ).  Discussed with Adline Peals PA will increase amlodipine to 5mg  a day and follow BPs. Would keep metoprolol at 12.5mg  BID since HR staying around 58. Pt to call Monday with update of BP.

## 2020-10-06 NOTE — Telephone Encounter (Signed)
Pt BP improved on increase dose of amlodipine. BP 134/68 HR 56 Will continue current medications until follow up later this week.

## 2020-10-09 NOTE — Addendum Note (Signed)
Encounter addended by: Sherran Needs, NP on: 10/09/2020 8:36 AM  Actions taken: Level of Service modified

## 2020-10-10 ENCOUNTER — Ambulatory Visit (HOSPITAL_COMMUNITY)
Admission: RE | Admit: 2020-10-10 | Discharge: 2020-10-10 | Disposition: A | Discharge status: Home / Self Care | Payer: Medicare Other | Source: Ambulatory Visit | Attending: Nurse Practitioner | Admitting: Nurse Practitioner

## 2020-10-10 ENCOUNTER — Other Ambulatory Visit: Payer: Self-pay

## 2020-10-10 VITALS — BP 148/80 | HR 62 | Ht 63.0 in | Wt 211.6 lb

## 2020-10-10 DIAGNOSIS — Z7901 Long term (current) use of anticoagulants: Secondary | ICD-10-CM | POA: Diagnosis not present

## 2020-10-10 DIAGNOSIS — I4819 Other persistent atrial fibrillation: Secondary | ICD-10-CM | POA: Diagnosis not present

## 2020-10-10 DIAGNOSIS — I491 Atrial premature depolarization: Secondary | ICD-10-CM | POA: Insufficient documentation

## 2020-10-10 DIAGNOSIS — I4891 Unspecified atrial fibrillation: Secondary | ICD-10-CM

## 2020-10-10 DIAGNOSIS — D6869 Other thrombophilia: Secondary | ICD-10-CM

## 2020-10-10 DIAGNOSIS — I10 Essential (primary) hypertension: Secondary | ICD-10-CM | POA: Diagnosis not present

## 2020-10-10 LAB — COMPREHENSIVE METABOLIC PANEL
ALT: 38 U/L (ref 0–44)
AST: 36 U/L (ref 15–41)
Albumin: 3.9 g/dL (ref 3.5–5.0)
Alkaline Phosphatase: 74 U/L (ref 38–126)
Anion gap: 11 (ref 5–15)
BUN: 18 mg/dL (ref 8–23)
CO2: 19 mmol/L — ABNORMAL LOW (ref 22–32)
Calcium: 8.9 mg/dL (ref 8.9–10.3)
Chloride: 106 mmol/L (ref 98–111)
Creatinine, Ser: 1.07 mg/dL — ABNORMAL HIGH (ref 0.44–1.00)
GFR, Estimated: 55 mL/min — ABNORMAL LOW (ref >60)
Glucose, Bld: 87 mg/dL (ref 70–99)
Potassium: 4.8 mmol/L (ref 3.5–5.1)
Sodium: 136 mmol/L (ref 135–145)
Total Bilirubin: 0.5 mg/dL (ref 0.3–1.2)
Total Protein: 7 g/dL (ref 6.5–8.1)

## 2020-10-10 LAB — TSH: TSH: 3.858 u[IU]/mL (ref 0.350–4.500)

## 2020-10-10 MED ORDER — AMLODIPINE BESYLATE 5 MG PO TABS: 5.0000 mg | ORAL_TABLET | Freq: Every day | ORAL | 6 refills | Status: DC

## 2020-10-10 NOTE — Progress Notes (Addendum)
Primary Care Physician: Orpah Melter, MD Referring Physician: Dr. Burt Knack  Endocrinologist: Dr. Elyse Hsu    Vivien Vilchis is a 75 y.o. female with a h/o new onset afib found at a MD's appointment early in October. She was seen by Dr. Burt Knack started on rate control and eliquis for a CHA2DS2VASc score of 3. She was then set up for cardioversion which was successful 11/19. She  did well for about a week, until she found Sunday her brother and his wife were divorcing after 78 years of marriage. She could tell she went back into afib with that news. EKG shows afib with RVR  in the 120's. 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 Burt Knack. She appears to be tolerating the afib well.   F/u in afib clinic, 08/29/20. She is better controlled with increase of BB. She still has questions re start of amiodarone vrs tikosyn to restore SR, but she is leaning toward amiodarone. She  does not want to make changes prior to her trip to [place] 12/17-20th. She would also be more comfortable fi I will let her Thyroid MD know re potential amio use and see if he has any concerns starting this drug.if amio is used I would like to see it as a bridge to ablation in 3-6 months.   F/u in afib clinic 09/25/20. Pt  had been in  [place] and ended up in the hospital with afib with RVR Christmas Day. She was there 3 days. They were eating out alot  and she feels got more salt than  usual.  She was loaded on amiodarone and successfully  cardioverted. She  was d/c on amio load, 200 mg bid . She called the office yesterday as she felt weak since D/c and reported that she was taking amio 200 mg bid and metoprolol 50 mg bid, with HR of 63 bpm,  and was told to decrease BB to 25 mg bid and appointment was given for today. She still says that she can not lay flat to sleep. Some HR's at home in the upper 40's.  We also  started lasix 20 mg yesterday for the orthopnea. She feels a little better today. Pulse OX has  been in the upper 90's. She  has been staying in [place].   F/u in afib clinic, 10/11/19. She is now on amiodarone 200 mg daily and  is staying in rhythm at 62 bpm. She is no longer on the lasix, diuresed and has not had any further issues with orthopnea. Wt is down 6.5 lbs.   She  called last week with BP concerns and amlodipine was increased. BP today 148/80.  At home earlier 130/80. She feels much improved.   Today, she denies symptoms of palpitations, chest pain, shortness of breath, orthopnea, PND, lower extremity edema, dizziness, presyncope, syncope, or neurologic sequela. The patient is tolerating medications without difficulties and is otherwise without complaint today.   Past Medical History:  Diagnosis Date  . Hypertension   . Thyroid disease    Past Surgical History:  Procedure Laterality Date  . APPENDECTOMY    . CARDIOVERSION N/A 08/15/2020   Procedure: CARDIOVERSION;  Surgeon: Sanda Klein, MD;  Location: [facility];  Service: Cardiovascular;  Laterality: N/A;  . HYSTERECTOMY ABDOMINAL WITH SALPINGECTOMY      Current Outpatient Medications  Medication Sig Dispense Refill  . acetaminophen (TYLENOL) 650 MG CR tablet Take 650 mg by mouth every 8 (eight) hours as needed (  pain.).     Marland Kitchen amiodarone (PACERONE) 200 MG tablet Take 200 mg by mouth daily.    Marland Kitchen amLODipine (NORVASC) 2.5 MG tablet Take 5 mg by mouth daily after lunch.    Marland Kitchen apixaban (ELIQUIS) 5 MG TABS tablet Take 1 tablet (5 mg total) by mouth 2 (two) times daily. 180 tablet 3  . furosemide (LASIX) 20 MG tablet Take 1 tablet by mouth daily for 3 days then only as needed for fluid retention 10 tablet 0  . levothyroxine (SYNTHROID) 125 MCG tablet Take 125 mcg by mouth daily before breakfast.    . loratadine (CLARITIN) 10 MG tablet Take 10 mg by mouth daily as needed for allergies.    . metoprolol tartrate (LOPRESSOR) 25 MG tablet Take 1 tablet (25 mg total) by mouth 2 (two) times daily. (Patient taking differently: Take 12.5  mg by mouth 2 (two) times daily.) 270 tablet 3   No current facility-administered medications for this encounter.    Allergies  Allergen Reactions  . Penicillins Rash  . Sulfamethoxazole Rash    Social History   Socioeconomic History  . Marital status: Married    Spouse name: Not on file  . Number of children: Not on file  . Years of education: Not on file  . Highest education level: Not on file  Occupational History  . Not on file  Tobacco Use  . Smoking status: Never Smoker  . Smokeless tobacco: Never Used  Substance and Sexual Activity  . Alcohol use: Never  . Drug use: Never  . Sexual activity: Not on file  Other Topics Concern  . Not on file  Social History Narrative  . Not on file   Social Determinants of Health   Financial Resource Strain: Not on file  Food Insecurity: Not on file  Transportation Needs: Not on file  Physical Activity: Not on file  Stress: Not on file  Social Connections: Not on file  Intimate Partner Violence: Not on file    No family history on file.  ROS- All systems are reviewed and negative except as per the HPI above  Physical Exam: Vitals:   10/10/20 1326  Weight: 96 kg  Height: 5\' 3"  (1.6 m)   Wt Readings from Last 3 Encounters:  10/10/20 96 kg  09/25/20 99.1 kg  08/29/20 97.5 kg    Labs: Lab Results  Component Value Date   NA 139 07/18/2020   K 4.8 07/18/2020   CL 103 07/18/2020   CO2 23 07/18/2020   GLUCOSE 91 07/18/2020   BUN 18 07/18/2020   CREATININE 0.90 07/18/2020   CALCIUM 9.5 07/18/2020   No results found for: INR No results found for: CHOL, HDL, LDLCALC, TRIG   GEN- The patient is well appearing, alert and oriented x 3 today.   Head- normocephalic, atraumatic Eyes-  Sclera clear, conjunctiva pink Ears- hearing intact Oropharynx- clear Neck- supple, no JVP Lymph- no cervical lymphadenopathy Lungs- Clear to ausculation bilaterally, normal work of breathing Heart-  regular rate and rhythm, no  murmurs, rubs or gallops, PMI not laterally displaced GI- soft, NT, ND, + BS Extremities- no clubbing, cyanosis, or edema MS- no significant deformity or atrophy Skin- no rash or lesion Psych- euthymic mood, full affect Neuro- strength and sensation are intact  EKG sinus rhythm at 62 bpm, pr int 2020 ms, qrs int 84 bpm, qtc 452 ms   Echo-1. Left ventricular ejection fraction, by estimation, is 30 to 35%. The  left ventricle has moderately decreased  function. The left ventricle  demonstrates global hypokinesis. Left ventricular diastolic function could  not be evaluated.  2. Right ventricular systolic function is low normal. The right  ventricular size is normal. There is mildly elevated pulmonary artery  systolic pressure. The estimated right ventricular systolic pressure is  95.6 mmHg.  3. Right atrial size was mildly dilated.  4. The mitral valve is abnormal. Moderate mitral valve regurgitation.  5. The aortic valve is tricuspid. Aortic valve regurgitation is not  visualized.  6. The inferior vena cava is normal in size with <50% respiratory  variability, suggesting right atrial pressure of 8 mmHg.   Assessment and Plan: 1. Persistent afib Dx in October 2021 Successful cardioversion but with ERAF We had planned on loading on amiodarone after the holidays as she was going out of town She  unfortunately had afib with RVR and presented to local hospital in [place] that transferred her to [place]  There she  was given IV amiodarone and successfully cardioverted   She is now staying in SR on amiodarone 200 mg qd  and metoprolol   12.5 mg bid  Orthopnea and  weakness resolved with short term use of diuretic with 6.5 lb loss  Tsh/cmet today   2. CHA2DS2VASc score of 3 Continue  eliquis 5 mg bid   3. HTN Stable now that amlodipine was increased to 5 mg qd   F/u  here in one month   Butch Penny C. Saren Corkern, [facility] [address] [place], [place] [id number] [phone]

## 2020-11-10 ENCOUNTER — Ambulatory Visit (HOSPITAL_COMMUNITY): Payer: 59 | Admitting: Nurse Practitioner

## 2020-11-19 ENCOUNTER — Encounter (HOSPITAL_COMMUNITY): Payer: Self-pay | Admitting: Nurse Practitioner

## 2020-11-19 ENCOUNTER — Ambulatory Visit (HOSPITAL_COMMUNITY)
Admission: RE | Admit: 2020-11-19 | Discharge: 2020-11-19 | Disposition: A | Discharge status: Home / Self Care | Payer: Medicare Other | Source: Ambulatory Visit | Attending: Nurse Practitioner | Admitting: Nurse Practitioner

## 2020-11-19 ENCOUNTER — Other Ambulatory Visit: Payer: Self-pay

## 2020-11-19 VITALS — BP 140/80 | HR 59 | Ht 63.0 in | Wt 214.0 lb

## 2020-11-19 DIAGNOSIS — R531 Weakness: Secondary | ICD-10-CM | POA: Diagnosis not present

## 2020-11-19 DIAGNOSIS — Z880 Allergy status to penicillin: Secondary | ICD-10-CM | POA: Insufficient documentation

## 2020-11-19 DIAGNOSIS — Z7901 Long term (current) use of anticoagulants: Secondary | ICD-10-CM | POA: Insufficient documentation

## 2020-11-19 DIAGNOSIS — I10 Essential (primary) hypertension: Secondary | ICD-10-CM | POA: Insufficient documentation

## 2020-11-19 DIAGNOSIS — Z882 Allergy status to sulfonamides status: Secondary | ICD-10-CM | POA: Diagnosis not present

## 2020-11-19 DIAGNOSIS — I4819 Other persistent atrial fibrillation: Secondary | ICD-10-CM | POA: Diagnosis present

## 2020-11-19 DIAGNOSIS — R0601 Orthopnea: Secondary | ICD-10-CM | POA: Insufficient documentation

## 2020-11-19 DIAGNOSIS — D6869 Other thrombophilia: Secondary | ICD-10-CM | POA: Diagnosis not present

## 2020-11-19 DIAGNOSIS — Z79899 Other long term (current) drug therapy: Secondary | ICD-10-CM | POA: Diagnosis not present

## 2020-11-19 NOTE — Progress Notes (Signed)
Primary Care Physician: Orpah Melter, MD Referring Physician: Dr. Burt Knack  Endocrinologist: Dr. Elyse Hsu    Michelle Watkins is a 75 y.o. female with a h/o new onset afib found at a MD's appointment early in October. She was seen by Dr. Burt Knack started on rate control and eliquis for a CHA2DS2VASc score of 3. She was then set up for cardioversion which was successful 11/19. She  did well for about a week, until she found Sunday her brother and his wife were divorcing after 74 years of marriage. She could tell she went back into afib with that news. EKG shows afib with RVR  in the 120's. 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 Burt Knack. She appears to be tolerating the afib well.   F/u in afib clinic, 08/29/20. She is better controlled with increase of BB. She still has questions re start of amiodarone vrs tikosyn to restore SR, but she is leaning toward amiodarone. She  does not want to make changes prior to her trip to [place] 12/17-20th. She would also be more comfortable fi I will let her Thyroid MD know re potential amio use and see if he has any concerns starting this drug.if amio is used I would like to see it as a bridge to ablation in 3-6 months.   F/u in afib clinic 09/25/20. Pt  had been in  [place] and ended up in the hospital with afib with RVR Christmas Day. She was there 3 days. They were eating out alot  and she feels got more salt than  usual.  She was loaded on amiodarone and successfully  cardioverted. She  was d/c on amio load, 200 mg bid . She called the office yesterday as she felt weak since D/c and reported that she was taking amio 200 mg bid and metoprolol 50 mg bid, with HR of 63 bpm,  and was told to decrease BB to 25 mg bid and appointment was given for today. She still says that she can not lay flat to sleep. Some HR's at home in the upper 40's.  We also  started lasix 20 mg yesterday for the orthopnea. She feels a little better today. Pulse OX has  been in the upper 90's. She  has been staying in [place].   F/u in afib clinic, 10/11/19. She is now on amiodarone 200 mg daily and  is staying in rhythm at 62 bpm. She is no longer on the lasix, diuresed and has not had any further issues with orthopnea. Wt is down 6.5 lbs.   She  called last week with BP concerns and amlodipine was increased. BP today 148/80.  At home earlier 130/80. She feels much improved.   F/u in afib clinic 11/19/20. Pt is staying in [place]. No significant issues with swelling or shortness of breath. She continues on amiodarone 200 mg daily.  Today, she denies symptoms of palpitations, chest pain, shortness of breath, orthopnea, PND, lower extremity edema, dizziness, presyncope, syncope, or neurologic sequela. The patient is tolerating medications without difficulties and is otherwise without complaint today.   Past Medical History:  Diagnosis Date  . Hypertension   . Thyroid disease    Past Surgical History:  Procedure Laterality Date  . APPENDECTOMY    . CARDIOVERSION N/A 08/15/2020   Procedure: CARDIOVERSION;  Surgeon: Sanda Klein, MD;  Location: [facility];  Service: Cardiovascular;  Laterality: N/A;  . HYSTERECTOMY ABDOMINAL WITH SALPINGECTOMY  Current Outpatient Medications  Medication Sig Dispense Refill  . acetaminophen (TYLENOL) 650 MG CR tablet Take 650 mg by mouth every 8 (eight) hours as needed (pain.).     Marland Kitchen amiodarone (PACERONE) 200 MG tablet Take 200 mg by mouth daily.    Marland Kitchen amLODipine (NORVASC) 5 MG tablet Take 1 tablet (5 mg total) by mouth daily after lunch. 30 tablet 6  . apixaban (ELIQUIS) 5 MG TABS tablet Take 1 tablet (5 mg total) by mouth 2 (two) times daily. 180 tablet 3  . furosemide (LASIX) 20 MG tablet Take 1 tablet by mouth daily for 3 days then only as needed for fluid retention 10 tablet 0  . levothyroxine (SYNTHROID) 125 MCG tablet Take 125 mcg by mouth daily before breakfast.    . loratadine (CLARITIN) 10 MG tablet Take 10 mg by mouth  daily as needed for allergies.    . metoprolol tartrate (LOPRESSOR) 25 MG tablet Take 1 tablet (25 mg total) by mouth 2 (two) times daily. (Patient taking differently: Take 12.5 mg by mouth 2 (two) times daily.) 270 tablet 3   No current facility-administered medications for this encounter.    Allergies  Allergen Reactions  . Penicillins Rash  . Sulfamethoxazole Rash    Social History   Socioeconomic History  . Marital status: Married    Spouse name: Not on file  . Number of children: Not on file  . Years of education: Not on file  . Highest education level: Not on file  Occupational History  . Not on file  Tobacco Use  . Smoking status: Never Smoker  . Smokeless tobacco: Never Used  Substance and Sexual Activity  . Alcohol use: Never  . Drug use: Never  . Sexual activity: Not on file  Other Topics Concern  . Not on file  Social History Narrative  . Not on file   Social Determinants of Health   Financial Resource Strain: Not on file  Food Insecurity: Not on file  Transportation Needs: Not on file  Physical Activity: Not on file  Stress: Not on file  Social Connections: Not on file  Intimate Partner Violence: Not on file    No family history on file.  ROS- All systems are reviewed and negative except as per the HPI above  Physical Exam: There were no vitals filed for this visit. Wt Readings from Last 3 Encounters:  10/10/20 96 kg  09/25/20 99.1 kg  08/29/20 97.5 kg    Labs: Lab Results  Component Value Date   NA 136 10/10/2020   K 4.8 10/10/2020   CL 106 10/10/2020   CO2 19 (L) 10/10/2020   GLUCOSE 87 10/10/2020   BUN 18 10/10/2020   CREATININE 1.07 (H) 10/10/2020   CALCIUM 8.9 10/10/2020   No results found for: INR No results found for: CHOL, HDL, LDLCALC, TRIG   GEN- The patient is well appearing, alert and oriented x 3 today.   Head- normocephalic, atraumatic Eyes-  Sclera clear, conjunctiva pink Ears- hearing intact Oropharynx-  clear Neck- supple, no JVP Lymph- no cervical lymphadenopathy Lungs- Clear to ausculation bilaterally, normal work of breathing Heart-  regular rate and rhythm, no murmurs, rubs or gallops, PMI not laterally displaced GI- soft, NT, ND, + BS Extremities- no clubbing, cyanosis, or edema MS- no significant deformity or atrophy Skin- no rash or lesion Psych- euthymic mood, full affect Neuro- strength and sensation are intact  EKG sinus rhythm at 59 bpm, pr int 204 ms, qrs int 86  bpm, qtc 443 ms   Echo-1. Left ventricular ejection fraction, by estimation, is 30 to 35%. The  left ventricle has moderately decreased function. The left ventricle  demonstrates global hypokinesis. Left ventricular diastolic function could  not be evaluated.  2. Right ventricular systolic function is low normal. The right  ventricular size is normal. There is mildly elevated pulmonary artery  systolic pressure. The estimated right ventricular systolic pressure is  93.1 mmHg.  3. Right atrial size was mildly dilated.  4. The mitral valve is abnormal. Moderate mitral valve regurgitation.  5. The aortic valve is tricuspid. Aortic valve regurgitation is not  visualized.  6. The inferior vena cava is normal in size with <50% respiratory  variability, suggesting right atrial pressure of 8 mmHg.   Assessment and Plan: 1. Persistent afib Dx in October 2021 Successful cardioversion but with ERAF We had planned on loading on amiodarone after the holidays as she was going out of town She  unfortunately had afib with RVR and presented to local hospital in [place] that transferred her to [place]  There she  was given IV amiodarone and successfully cardioverted   She is now staying in SR on amiodarone 200 mg qd  and metoprolol   12.5 mg bid  Orthopnea and  weakness resolved with short term use of diuretic with 6.5 lb loss   2. CHA2DS2VASc score of 3 Continue  eliquis 5 mg bid   3. HTN Stable now that  amlodipine was increased to 5 mg qd   F/u with Nicki Reaper Weaver/Dr. Copper and I can see in afib clinic as needed  She will need f/u amio labs at next visit    Butch Penny C. Carroll, [facility] [address] [place], [place] [address] [phone]

## 2021-01-06 ENCOUNTER — Encounter: Payer: Self-pay | Admitting: Physician Assistant

## 2021-01-06 ENCOUNTER — Ambulatory Visit: Payer: Medicare Other | Admitting: Physician Assistant

## 2021-01-06 ENCOUNTER — Other Ambulatory Visit: Payer: Self-pay

## 2021-01-06 VITALS — BP 132/72 | HR 63 | Ht 63.0 in | Wt 216.2 lb

## 2021-01-06 DIAGNOSIS — I5020 Unspecified systolic (congestive) heart failure: Secondary | ICD-10-CM | POA: Insufficient documentation

## 2021-01-06 DIAGNOSIS — I10 Essential (primary) hypertension: Secondary | ICD-10-CM | POA: Diagnosis not present

## 2021-01-06 DIAGNOSIS — I4819 Other persistent atrial fibrillation: Secondary | ICD-10-CM | POA: Diagnosis not present

## 2021-01-06 MED ORDER — VALSARTAN 80 MG PO TABS: 80.0000 mg | ORAL_TABLET | Freq: Every day | ORAL | 3 refills | Status: DC

## 2021-01-06 NOTE — Patient Instructions (Signed)
Medication Instructions:  Your physician has recommended you make the following change in your medication:   1. Discontinue Amlodipine 2.  Start valsartan one table by mouth ( 80 mg) daily, sent in # 90 to requested pharmacy.   *If you need a refill on your cardiac medications before your next appointment, please call your pharmacy*   Lab Work: Your physician recommends that you return for lab work in: two weeks: BMET/CBC/LFT.  If you have labs (blood work) drawn today and your tests are completely normal, you will receive your results only by: Marland Kitchen MyChart Message (if you have MyChart) OR . A paper copy in the mail If you have any lab test that is abnormal or we need to change your treatment, we will call you to review the results.   Testing/Procedures: Your physician has requested that you have an LIMITED echocardiogram. Echocardiography is a painless test that uses sound waves to create images of your heart. It provides your doctor with information about the size and shape of your heart and how well your heart's chambers and valves are working. This procedure takes approximately one hour. There are no restrictions for this procedure.  Your physician has recommended that you have a pulmonary function test. Pulmonary Function Tests are a group of tests that measure how well air moves in and out of your lungs.     Follow-Up: At [facility], you and your health needs are our priority.  As part of our continuing mission to provide you with exceptional heart care, we have created designated Provider Care Teams.  These Care Teams include your primary Cardiologist (physician) and Advanced Practice Providers (APPs -  Physician Assistants and Nurse Practitioners) who all work together to provide you with the care you need, when you need it.  We recommend signing up for the patient portal called "MyChart".  Sign up information is provided on this After Visit Summary.  MyChart is used to connect  with patients for Virtual Visits (Telemedicine).  Patients are able to view lab/test results, encounter notes, upcoming appointments, etc.  Non-urgent messages can be sent to your provider as well.   To learn more about what you can do with MyChart, go to [email or web address].    Your next appointment:   6 month(s)  The format for your next appointment:   In Person  Provider:   You may see Sherren Mocha, MD or one of the following Advanced Practice Providers on your designated Care Team:    Richardson Dopp, PA-C     Other Instructions Your physician wants you to follow-up in: 6 month with Dr. Burt Knack or Richardson Dopp, PA.  You will receive a reminder letter in the mail two months in advance. If you don't receive a letter, please call our office to schedule the follow-up appointment.   Check BP for 2 weeks than send readings please.

## 2021-01-06 NOTE — Progress Notes (Addendum)
Cardiology Office Note:    Date:  01/06/2021   ID:  Michelle Watkins, DOB 07-12-1946, MRN [id number]  PCP:  Michelle Watkins, [facility]  Cardiologist:  Michelle Mocha, MD   Advanced Practice Provider:  Liliane Shi, PA-C Electrophysiologist:  None       Referring MD: Michelle Melter, MD   Chief Complaint:  Follow-up (AFib)    Patient Profile:     Michelle Watkins is a 75 y.o. female with:   Persistent Atrial fibrillation   S/p DCCV 07/2020  [facility] in [place], [place] w AF w RVR in 08/2020>>Amio/DCCV  (HFrEF) heart failure with reduced ejection fraction   Hypertension   Hypothyroidism   Prior CV studies:  Echocardiogram 08/13/20 EF 30-35, global HK, low normal RVSF, RVSP 39.6, mild RAE, mod MR     History of Present Illness:    [name] returns for follow-up.  She is here alone.  She has not had any symptoms of recurrent atrial fibrillation.  She has not had shortness of breath, chest pain, syncope, orthopnea.  She has had lower extremity edema.  This seems to improve with elevation.         Past Medical History:  Diagnosis Date  . HFrEF (heart failure with reduced ejection fraction) (HCC)    EF 30-35, global HK, low normal RVSF, RVSP 39.6, mild RAE, mod MR  . Hypertension   . Persistent atrial fibrillation (HCC)    Amio Rx//Apixaban Rx  . Thyroid disease     Current Medications: Current Meds  Medication Sig  . acetaminophen (TYLENOL) 650 MG CR tablet Take 650 mg by mouth every 8 (eight) hours as needed (pain.).   Marland Kitchen amiodarone (PACERONE) 200 MG tablet Take 200 mg by mouth daily.  Marland Kitchen apixaban (ELIQUIS) 5 MG TABS tablet Take 1 tablet (5 mg total) by mouth 2 (two) times daily.  . furosemide (LASIX) 20 MG tablet Take 1 tablet by mouth daily for 3 days then only as needed for fluid retention  . levothyroxine (SYNTHROID) 125 MCG tablet Take 125 mcg by mouth daily before breakfast.  . loratadine (CLARITIN) 10 MG tablet Take 10 mg by  mouth daily as needed for allergies.  . metoprolol tartrate (LOPRESSOR) 25 MG tablet Take 1 tablet (25 mg total) by mouth 2 (two) times daily.  . valsartan (DIOVAN) 80 MG tablet Take 1 tablet (80 mg total) by mouth daily.  . [DISCONTINUED] amLODipine (NORVASC) 5 MG tablet Take 1 tablet (5 mg total) by mouth daily after lunch.     Allergies:   Penicillins and Sulfamethoxazole   Social History   Tobacco Use  . Smoking status: Never Smoker  . Smokeless tobacco: Never Used  Substance Use Topics  . Alcohol use: Never  . Drug use: Never     Family Hx: The patient's family history is not on file.  Review of Systems  Gastrointestinal: Negative for hematochezia.  Genitourinary: Negative for hematuria.     EKGs/Labs/Other Test Reviewed:    EKG:  EKG is   ordered today.  The ekg ordered today demonstrates normal sinus rhythm, heart rate 63, left axis deviation, no ST-T wave changes, QTC 454  Recent Labs: 07/18/2020: Hemoglobin 13.8; Platelets 319 10/10/2020: ALT 38; BUN 18; Creatinine, Ser 1.07; Potassium 4.8; Sodium 136; TSH 3.858   Recent Lipid Panel No results found for: CHOL, TRIG, HDL, CHOLHDL, LDLCALC, LDLDIRECT   Risk Assessment/Calculations:    CHA2DS2-VASc Score = 4  This indicates a  4.8% annual risk of stroke. The patient's score is based upon: CHF History: Yes HTN History: Yes Diabetes History: No Stroke History: No Vascular Disease History: No Age Score: 1 Gender Score: 1     Physical Exam:    VS:  BP 132/72   Pulse 63   Ht 5\' 3"  (1.6 m)   Wt 216 lb 3.2 oz (98.1 kg)   SpO2 97%   BMI 38.30 kg/m     Wt Readings from Last 3 Encounters:  01/06/21 216 lb 3.2 oz (98.1 kg)  11/19/20 214 lb (97.1 kg)  10/10/20 211 lb 9.6 oz (96 kg)     Constitutional:      Appearance: Healthy appearance. Not in distress.  Neck:     Vascular: No JVR. JVD normal.  Pulmonary:     Effort: Pulmonary effort is normal.     Breath sounds: No wheezing. No rales.   Cardiovascular:     Normal rate. Regular rhythm. Normal S1. Normal S2.     Murmurs: There is no murmur.  Pulses:    Intact distal pulses.  Edema:    Pretibial: bilateral trace edema of the pretibial area.    Ankle: bilateral 1+ edema of the ankle. Abdominal:     Palpations: Abdomen is soft. There is no hepatomegaly.  Skin:    General: Skin is warm and dry.  Neurological:     Mental Status: Alert and oriented to person, place and time.     Cranial Nerves: Cranial nerves are intact.          ASSESSMENT & PLAN:    1. Persistent atrial fibrillation ([place]) She is maintaining normal sinus rhythm on amiodarone therapy.  She is tolerating anticoagulation.  I am going to adjust her blood pressure medications as outlined below.  Arrange follow-up CBC, BMET, LFTs in 2 weeks.  She does not have baseline PFTs in her chart.  I will arrange these.  We discussed the importance of annual eye exams.  Follow-up in 6 months.  2. HFrEF (heart failure with reduced ejection fraction) ([place]) EF 30-35 by echocardiogram 11/21.  NYHA I-II.  Volume status stable.  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  She is having issues with swelling which I think are related to amlodipine.  I will discontinue this and place her on ARB therapy as outlined below.  Continue beta-blocker.  Arrange follow-up limited echo to recheck EF now that she has maintained sinus rhythm for the last few months.  3. Essential hypertension Fair control.  She does have lower extremity swelling which I think is related to amlodipine.  I have recommended stopping amlodipine and placing her on valsartan 80 mg daily.  Obtain follow-up BMET in 2 weeks.  She will see me blood pressure readings after 2 weeks of therapy.   Dispo:  Return in about 6 months (around 07/08/2021) for Routine Follow Up, w/ Dr. Burt Knack, or Richardson Dopp, PA-C, in person.   Medication Adjustments/Labs and Tests Ordered: Current medicines are reviewed at length  with the patient today.  Concerns regarding medicines are outlined above.  Tests Ordered: Orders Placed This Encounter  Procedures  . Basic Metabolic Panel (BMET)  . Hepatic function panel  . CBC  . EKG 12-Lead  . ECHOCARDIOGRAM LIMITED  . Pulmonary function test   Medication Changes: Meds ordered this encounter  Medications  . valsartan (DIOVAN) 80 MG tablet    Sig: Take 1 tablet (80 mg total) by mouth daily.    Dispense:  90 tablet    Refill:  3    Signed, Richardson Dopp, PA-C  01/06/2021 5:12 PM    [facility] [place], [place], [place]  [address] Phone: [phone]; Fax: [phone]

## 2021-01-20 ENCOUNTER — Other Ambulatory Visit: Payer: Self-pay

## 2021-01-20 ENCOUNTER — Other Ambulatory Visit: Payer: Medicare Other | Admitting: *Deleted

## 2021-01-20 ENCOUNTER — Encounter (INDEPENDENT_AMBULATORY_CARE_PROVIDER_SITE_OTHER): Payer: Self-pay

## 2021-01-20 DIAGNOSIS — I5020 Unspecified systolic (congestive) heart failure: Secondary | ICD-10-CM

## 2021-01-20 DIAGNOSIS — I10 Essential (primary) hypertension: Secondary | ICD-10-CM

## 2021-01-20 DIAGNOSIS — I4819 Other persistent atrial fibrillation: Secondary | ICD-10-CM

## 2021-01-20 LAB — CBC
Hematocrit: 42.4 % (ref 34.0–46.6)
Hemoglobin: 13.6 g/dL (ref 11.1–15.9)
MCH: 29.6 pg (ref 26.6–33.0)
MCHC: 32.1 g/dL (ref 31.5–35.7)
MCV: 92 fL (ref 79–97)
Platelets: 312 10*3/uL (ref 150–450)
RBC: 4.6 x10E6/uL (ref 3.77–5.28)
RDW: 15 % (ref 11.7–15.4)
WBC: 6.3 10*3/uL (ref 3.4–10.8)

## 2021-01-20 LAB — BASIC METABOLIC PANEL
BUN/Creatinine Ratio: 15 (ref 12–28)
BUN: 15 mg/dL (ref 8–27)
CO2: 25 mmol/L (ref 20–29)
Calcium: 9.3 mg/dL (ref 8.7–10.3)
Chloride: 101 mmol/L (ref 96–106)
Creatinine, Ser: 0.99 mg/dL (ref 0.57–1.00)
Glucose: 99 mg/dL (ref 65–99)
Potassium: 4.7 mmol/L (ref 3.5–5.2)
Sodium: 137 mmol/L (ref 134–144)
eGFR: 60 mL/min/{1.73_m2} (ref >59)

## 2021-01-20 LAB — HEPATIC FUNCTION PANEL
ALT: 30 IU/L (ref 0–32)
AST: 22 IU/L (ref 0–40)
Albumin: 4.2 g/dL (ref 3.7–4.7)
Alkaline Phosphatase: 93 IU/L (ref 44–121)
Bilirubin Total: 0.2 mg/dL (ref 0.0–1.2)
Bilirubin, Direct: <0.1 mg/dL (ref 0.00–0.40)
Total Protein: 6.9 g/dL (ref 6.0–8.5)

## 2021-01-20 MED ORDER — AMIODARONE HCL 200 MG PO TABS: 200.0000 mg | ORAL_TABLET | Freq: Every day | ORAL | 3 refills | Status: DC

## 2021-01-20 NOTE — Telephone Encounter (Signed)
Pt's medication was sent to pt's pharmacy as requested. Confirmation received.  °

## 2021-01-23 ENCOUNTER — Telehealth: Payer: Self-pay | Admitting: Physician Assistant

## 2021-01-23 DIAGNOSIS — I10 Essential (primary) hypertension: Secondary | ICD-10-CM

## 2021-01-23 MED ORDER — VALSARTAN 80 MG PO TABS: 80.0000 mg | ORAL_TABLET | Freq: Two times a day (BID) | ORAL | 3 refills | Status: DC

## 2021-01-23 NOTE — Telephone Encounter (Signed)
Received recent BP readings from home. BP is above goal. PLAN:  Increase Valsartan to 80 mg twice daily. Repeat BMET the day she comes in for her Echocardiogram in May. Richardson Dopp, PA-C    01/23/2021 1:41 PM

## 2021-01-23 NOTE — Telephone Encounter (Signed)
Spoke with patient about increasing Valsartan twice a day and obtaining a BMET on 02/04/21 when in office for Echo.  Patient verbalized understanding.

## 2021-01-24 ENCOUNTER — Emergency Department (HOSPITAL_COMMUNITY): Payer: Medicare Other

## 2021-01-24 ENCOUNTER — Emergency Department (HOSPITAL_COMMUNITY)
Admission: EM | Admit: 2021-01-24 | Discharge: 2021-01-24 | Disposition: A | Discharge status: Home / Self Care | Payer: Medicare Other | Attending: Emergency Medicine | Admitting: Emergency Medicine

## 2021-01-24 ENCOUNTER — Encounter (HOSPITAL_COMMUNITY): Payer: Self-pay | Admitting: Emergency Medicine

## 2021-01-24 DIAGNOSIS — I10 Essential (primary) hypertension: Secondary | ICD-10-CM | POA: Insufficient documentation

## 2021-01-24 DIAGNOSIS — Z7901 Long term (current) use of anticoagulants: Secondary | ICD-10-CM | POA: Insufficient documentation

## 2021-01-24 DIAGNOSIS — Z79899 Other long term (current) drug therapy: Secondary | ICD-10-CM | POA: Insufficient documentation

## 2021-01-24 DIAGNOSIS — R030 Elevated blood-pressure reading, without diagnosis of hypertension: Secondary | ICD-10-CM

## 2021-01-24 DIAGNOSIS — R079 Chest pain, unspecified: Secondary | ICD-10-CM | POA: Diagnosis present

## 2021-01-24 DIAGNOSIS — I4819 Other persistent atrial fibrillation: Secondary | ICD-10-CM | POA: Insufficient documentation

## 2021-01-24 LAB — CBC
HCT: 43.3 % (ref 36.0–46.0)
Hemoglobin: 13.9 g/dL (ref 12.0–15.0)
MCH: 29.8 pg (ref 26.0–34.0)
MCHC: 32.1 g/dL (ref 30.0–36.0)
MCV: 92.7 fL (ref 80.0–100.0)
Platelets: 315 10*3/uL (ref 150–400)
RBC: 4.67 MIL/uL (ref 3.87–5.11)
RDW: 14.5 % (ref 11.5–15.5)
WBC: 6.2 10*3/uL (ref 4.0–10.5)
nRBC: 0 % (ref 0.0–0.2)

## 2021-01-24 LAB — BASIC METABOLIC PANEL
Anion gap: 9 (ref 5–15)
BUN: 15 mg/dL (ref 8–23)
CO2: 23 mmol/L (ref 22–32)
Calcium: 9.2 mg/dL (ref 8.9–10.3)
Chloride: 106 mmol/L (ref 98–111)
Creatinine, Ser: 0.97 mg/dL (ref 0.44–1.00)
GFR, Estimated: >60 mL/min (ref >60)
Glucose, Bld: 97 mg/dL (ref 70–99)
Potassium: 4.1 mmol/L (ref 3.5–5.1)
Sodium: 138 mmol/L (ref 135–145)

## 2021-01-24 LAB — TROPONIN I (HIGH SENSITIVITY)
Troponin I (High Sensitivity): 4 ng/L (ref <18)
Troponin I (High Sensitivity): 5 ng/L (ref <18)

## 2021-01-24 NOTE — ED Provider Notes (Signed)
[facility] EMERGENCY DEPARTMENT Provider Note   CSN: [id number] Arrival date & time: 01/24/21  1038     History Chief Complaint  Patient presents with  . Chest Pain  . Hypertension    Michelle Watkins is a 75 y.o. female.  HPI      Michelle Watkins is a 75 y.o. female, with a history of HTN, A. fib treated with ablation, heart failure, presenting to the ED with concern over her blood pressure. She states she woke up around midnight feeling poorly as well as with vague, generalized chest discomfort.  She checked her blood pressure and found it to be 200/100.  She took 80 mg valsartan and began to feel better.  Symptoms then resolved.  When she woke later in the morning, she again felt poorly vague, mild chest discomfort.  Around 9 AM she took 25 mg metoprolol (typically takes 12.5 mg twice daily) and began to feel better.  She was symptom-free prior to arrival in the ED.  She notes when the cardiology office called to check on her and her blood pressure yesterday, she told them she had been having elevated blood pressure readings, and her valsartan was increased from 80 mg once daily to 80 mg twice daily.  Despite the med list showing patient taking 25 mg metoprolol twice daily, she states she has been prescribed 12.5 mg twice daily for a couple months.  Denies fever, shortness of breath, cough, abdominal pain, dizziness, syncope, numbness, weakness, vision deficits, N/V/D, or any other complaints.   Past Medical History:  Diagnosis Date  . HFrEF (heart failure with reduced ejection fraction) (HCC)    EF 30-35, global HK, low normal RVSF, RVSP 39.6, mild RAE, mod MR  . Hypertension   . Persistent atrial fibrillation (HCC)    Amio Rx//Apixaban Rx  . Thyroid disease     Patient Active Problem List   Diagnosis Date Noted  . HFrEF (heart failure with reduced ejection fraction) ([place])   . Persistent atrial fibrillation [facility])     Past Surgical History:   Procedure Laterality Date  . APPENDECTOMY    . CARDIOVERSION N/A 08/15/2020   Procedure: CARDIOVERSION;  Surgeon: Sanda Klein, MD;  Location: [facility] ENDOSCOPY;  Service: Cardiovascular;  Laterality: N/A;  . HYSTERECTOMY ABDOMINAL WITH SALPINGECTOMY       OB History   No obstetric history on file.     No family history on file.  Social History   Tobacco Use  . Smoking status: Never Smoker  . Smokeless tobacco: Never Used  Substance Use Topics  . Alcohol use: Never  . Drug use: Never    Home Medications Prior to Admission medications   Medication Sig Start Date End Date Taking? Authorizing Provider  acetaminophen (TYLENOL) 650 MG CR tablet Take 650 mg by mouth every 8 (eight) hours as needed (for pain).   Yes [provider]  amiodarone (PACERONE) 200 MG tablet Take 1 tablet (200 mg total) by mouth daily. 01/20/21  Yes Sherren Mocha, MD  apixaban (ELIQUIS) 5 MG TABS tablet Take 1 tablet (5 mg total) by mouth 2 (two) times daily. 07/18/20 07/13/21 Yes Sherren Mocha, MD  furosemide (LASIX) 20 MG tablet Take 1 tablet by mouth daily for 3 days then only as needed for fluid retention Patient taking differently: Take 20 mg by mouth daily as needed ("FOR FLUID RETENTION"). 09/24/20  Yes Sherran Needs, NP  loratadine (CLARITIN) 10 MG tablet Take 10 mg by mouth daily as needed  for allergies.   Yes [provider]  metoprolol tartrate (LOPRESSOR) 25 MG tablet Take 1 tablet (25 mg total) by mouth 2 (two) times daily. Patient taking differently: Take 12.5 mg by mouth 2 (two) times daily. 09/24/20 09/19/21 Yes Sherran Needs, NP  SYNTHROID 125 MCG tablet Take 125 mcg by mouth daily before breakfast.   Yes [provider]  valsartan (DIOVAN) 80 MG tablet Take 1 tablet (80 mg total) by mouth 2 (two) times daily. 01/23/21  Yes Weaver, Scott T, PA-C  levothyroxine (SYNTHROID) 125 MCG tablet Take 125 mcg by mouth daily before breakfast.    [provider]     Allergies    Penicillins and Sulfamethoxazole  Review of Systems   Review of Systems  Constitutional: Negative for chills, diaphoresis and fever.  Eyes: Negative for visual disturbance.  Respiratory: Negative for cough and shortness of breath.   Cardiovascular: Positive for chest pain. Negative for leg swelling.  Gastrointestinal: Negative for abdominal pain, diarrhea, nausea and vomiting.  Musculoskeletal: Negative for back pain.  Neurological: Negative for dizziness, syncope, weakness, numbness and headaches.  All other systems reviewed and are negative.  Physical Exam Updated Vital Signs BP (!) 170/99 (BP Location: Left Arm)   Pulse 60   Temp 98.4 F (36.9 C) (Oral)   Resp 14   SpO2 98%   Physical Exam Vitals and nursing note reviewed.  Constitutional:      General: She is not in acute distress.    Appearance: She is well-developed. She is not diaphoretic.  HENT:     Head: Normocephalic and atraumatic.     Mouth/Throat:     Mouth: Mucous membranes are moist.     Pharynx: Oropharynx is clear.  Eyes:     Conjunctiva/sclera: Conjunctivae normal.  Cardiovascular:     Rate and Rhythm: Normal rate and regular rhythm.     Pulses: Normal pulses.          Radial pulses are 2+ on the right side and 2+ on the left side.       Posterior tibial pulses are 2+ on the right side and 2+ on the left side.     Heart sounds: Normal heart sounds.     Comments: Tactile temperature in the extremities appropriate and equal bilaterally. Pulmonary:     Effort: Pulmonary effort is normal. No respiratory distress.     Breath sounds: Normal breath sounds.  Abdominal:     Palpations: Abdomen is soft.     Tenderness: There is no abdominal tenderness. There is no guarding.  Musculoskeletal:     Cervical back: Neck supple.     Right lower leg: No edema.     Left lower leg: No edema.  Lymphadenopathy:     Cervical: No cervical adenopathy.  Skin:    General: Skin is warm and dry.   Neurological:     Mental Status: She is alert.     Comments: No noted acute cognitive deficit. Sensation grossly intact to light touch in the extremities.   Grip strengths equal bilaterally.   Strength 5/5 in all extremities.  No gait disturbance.  Coordination intact.  Cranial nerves III-XII grossly intact.  Handles oral secretions without noted difficulty.  No noted phonation or speech deficit. No facial droop.   Psychiatric:        Mood and Affect: Mood and affect normal.        Speech: Speech normal.        Behavior: Behavior normal.  ED Results / Procedures / Treatments   Labs (all labs ordered are listed, but only abnormal results are displayed) Labs Reviewed  BASIC METABOLIC PANEL  CBC  TROPONIN I (HIGH SENSITIVITY)  TROPONIN I (HIGH SENSITIVITY)    EKG EKG Interpretation  Date/Time:  Saturday January 24 2021 10:46:10 EDT Ventricular Rate:  58 PR Interval:  206 QRS Duration: 88 QT Interval:  454 QTC Calculation: 445 R Axis:   2 Text Interpretation: Sinus bradycardia with Premature supraventricular complexes Low voltage QRS Cannot rule out Anterior infarct , age undetermined Abnormal ECG Confirmed by Aletta Edouard [phone]) on 01/24/2021 11:14:39 AM   Radiology DG Chest 2 View  Result Date: 01/24/2021 CLINICAL DATA:  Chest pain. EXAM: CHEST - 2 VIEW COMPARISON:  None. FINDINGS: Both lungs are clear. Heart and mediastinum are within normal limits. No pleural effusions. Bridging osteophytes in the thoracic spine. Negative for a pneumothorax. IMPRESSION: No active cardiopulmonary disease. Electronically Signed   By: Markus Daft M.D.   On: 01/24/2021 12:09    Procedures Procedures   Medications Ordered in ED Medications - No data to display  ED Course  I have reviewed the triage vital signs and the nursing notes.  Pertinent labs & imaging results that were available during my care of the patient were reviewed by me and considered in my medical decision  making (see chart for details).  Clinical Course as of 01/24/21 1609  Sat Jan 25, 6651  [id number] 75 year old female with history of hypertension here with some chest discomfort and generally not feeling good with elevation of her blood pressure its been going last few days.  She said her primary care doctor switch her blood pressure medicines because the last one was making her ankle swell.  Getting labs EKG delta troponin. [MB]  47 Spoke with Dr. Sallyanne Kuster, cardiologist.   We discussed the patient's previous symptoms as well as her hypertension. He agrees with the proposed plan of care of having the patient try her newly increased frequency of valsartan over the weekend.  He advises the patient should call the office on Monday to advise them of her blood pressure readings over the weekend. [SJ]    Clinical Course User Index [MB] Hayden Rasmussen, MD [SJ] Lorayne Bender, PA-C   MDM Rules/Calculators/A&P                          Patient presents with concern over elevated blood pressure readings.  Patient is nontoxic appearing, afebrile, not tachycardic, not tachypneic, not hypotensive, maintains excellent SPO2 on room air, and is in no apparent distress.   I have reviewed the patient's chart to obtain more information.  It was noted that the patient was only yesterday started on her increased frequency of valsartan. I reviewed and interpreted the patient's labs and radiological studies. Symptom-free throughout ED course.  Delta troponins negative.  No acute abnormalities noted on EKG.  Work-up overall reassuring. Return precautions discussed. Patient voices understanding of these instructions, accepts the plan, and is comfortable with discharge.  Findings and plan of care discussed with attending physician, Ronnald Ramp, MD.   Vitals:   01/24/21 1215 01/24/21 1224 01/24/21 1345 01/24/21 1500  BP:  (!) 168/94 (!) 136/97 (!) 144/67  Pulse: (!) 58 (!) 58 (!) 49 (!) 51  Resp: 19 19 14 17    Temp:      TempSrc:      SpO2: 98% 98% 99% 98%  Final Clinical Impression(s) / ED Diagnoses Final diagnoses:  Elevated blood pressure reading    Rx / DC Orders ED Discharge Orders    None       Layla Maw 01/24/21 1614    Hayden Rasmussen, MD 01/24/21 1746

## 2021-01-24 NOTE — Discharge Instructions (Signed)
Continue with your previously prescribed blood pressure regimen of valsartan 80 mg twice daily and metoprolol 12.5 mg twice daily. Follow-up with the cardiology office on Monday, May 2.  Call them and let them know your blood pressure readings from over the weekend  Return to the emergency department for recurrence of persistent chest pain, shortness of breath, dizziness, passing out, or any other major concerns.

## 2021-01-24 NOTE — ED Triage Notes (Signed)
Pt states she felt bad all night and took her BP and it was 200s/100.  Unable to sleep during the night.  Took an extra BP pill.  States she did have tightness across chest and mild SOB when BP was elevated.  Denies chest pain and SOB at present.

## 2021-01-26 ENCOUNTER — Telehealth: Payer: Self-pay | Admitting: Physician Assistant

## 2021-01-26 DIAGNOSIS — I4891 Unspecified atrial fibrillation: Secondary | ICD-10-CM

## 2021-01-26 DIAGNOSIS — R001 Bradycardia, unspecified: Secondary | ICD-10-CM

## 2021-01-26 DIAGNOSIS — I10 Essential (primary) hypertension: Secondary | ICD-10-CM

## 2021-01-26 NOTE — Telephone Encounter (Signed)
Spoke with pt who is complaining of episode of weakness and flushing that started about 12:24pm today.  Pt states her HR was in the 40's with BP just prior to episode of 151/101.  Immediately after episode 183/90 with HR of 70.  Pt states this is the second time this has happened since starting the Valsartan 80mg  bid.  Pt states she went to the ED 04/30 d/t the same type of event.  She reports it is not unusual for her HR to drop occasionally into 40's but not to have these same feelings of weakness and flushing.  Pt is taking medications as prescribed.  She denies current CP, SOB or dizziness/fainting.  She is scheduled for upcoming echo and PFT's. Will forward information to Richardson Dopp and pharmacy team for review and recommendation.  Reviewed ED precautions.  Pt verbalizes understanding and agrees with current plan.

## 2021-01-26 NOTE — Telephone Encounter (Signed)
Pt c/o BP issue: STAT if pt c/o blurred vision, one-sided weakness or slurred speech  1. What are your last 5 BP readings? Last night 9 pm 172/75 62, this morning 6:52 am: 127/66/57, 9 am 168/75/58, 10:45 am 147/76/58, 12:05 pm 144/86/50, 12:13 pm 151/101/53, started getting weak juste checked HR had dropped to 40's right before last, 12:24 pm 183/90/70, 12:45 152/78/62, 15 min later 172/81/60  2. Are you having any other symptoms (ex. Dizziness, headache, blurred vision, passed out)? no  3. What is your BP issue? Patient states he BP has been high. She states at 12:24 pm she started feeling weak and flushed. She states her BP was 183/90 HR 70. She states before checking her HR had dropped to the 40's. She states her face is cooling down and is still a little flushed, but she feels pretty normal now. She states when it got high she did feel SOB, but had no chest pain. She states she was seen at the ED recently and her BP medication was increased the end of last week to valsartan 80 mg 1 tablet twice a day.

## 2021-01-26 NOTE — Telephone Encounter (Signed)
Valsartan will not effect HR. Please arrange 14 day live Zio (AT) monitor.  If insurance will not cover, ok to change to 30 day event monitor. Change Amiodarone to 200 mg once daily except take 100 mg on Sat and Sun. Arrange f/u in HTN clinic to recheck BP..  Bring list of BP readings and bring BP monitor from home.   Richardson Dopp, PA-C    01/26/2021 5:32 PM

## 2021-01-27 ENCOUNTER — Ambulatory Visit (INDEPENDENT_AMBULATORY_CARE_PROVIDER_SITE_OTHER): Payer: Medicare Other

## 2021-01-27 ENCOUNTER — Encounter: Payer: Self-pay | Admitting: Radiology

## 2021-01-27 DIAGNOSIS — R001 Bradycardia, unspecified: Secondary | ICD-10-CM

## 2021-01-27 DIAGNOSIS — I4891 Unspecified atrial fibrillation: Secondary | ICD-10-CM

## 2021-01-27 DIAGNOSIS — I10 Essential (primary) hypertension: Secondary | ICD-10-CM

## 2021-01-27 MED ORDER — AMIODARONE HCL 200 MG PO TABS: ORAL_TABLET | ORAL | 3 refills | Status: DC

## 2021-01-27 NOTE — Progress Notes (Signed)
Enrolled patient for a 14 day Zio AT monitor to be mailed to patients home.  

## 2021-01-27 NOTE — Telephone Encounter (Signed)
Spoke with pt and advised per [facility] 14 day live Deputy monitor ordered.  Change Amiodarone to 200mg  - 1 tablet daily except on Saturday and Sunday will take 100mg  - 1/2 tablet by mouth. Appointment scheduled for 02/12/2021 at 11am with PharmD for HTN.  Pt advised to bring BP log and monitor from home.  Pt verbalizes understanding and agrees with current plan.

## 2021-01-29 DIAGNOSIS — I4891 Unspecified atrial fibrillation: Secondary | ICD-10-CM

## 2021-01-29 DIAGNOSIS — I10 Essential (primary) hypertension: Secondary | ICD-10-CM

## 2021-01-29 DIAGNOSIS — R001 Bradycardia, unspecified: Secondary | ICD-10-CM | POA: Diagnosis not present

## 2021-01-30 ENCOUNTER — Telehealth: Payer: Self-pay | Admitting: Physician Assistant

## 2021-01-30 NOTE — Telephone Encounter (Signed)
S/w pt is aware to wear Monitor for 14 days and it is ok to wear during echo.

## 2021-01-30 NOTE — Telephone Encounter (Signed)
Michelle Watkins is calling stating she received her heart monitor. She is wanting to know how long she is supposed to wear it. Please advise.

## 2021-02-04 ENCOUNTER — Other Ambulatory Visit: Payer: Medicare Other | Admitting: *Deleted

## 2021-02-04 ENCOUNTER — Encounter: Payer: Self-pay | Admitting: Physician Assistant

## 2021-02-04 ENCOUNTER — Other Ambulatory Visit: Payer: Self-pay

## 2021-02-04 ENCOUNTER — Ambulatory Visit (HOSPITAL_COMMUNITY): Payer: Medicare Other | Attending: Cardiology

## 2021-02-04 DIAGNOSIS — I10 Essential (primary) hypertension: Secondary | ICD-10-CM

## 2021-02-04 DIAGNOSIS — I5020 Unspecified systolic (congestive) heart failure: Secondary | ICD-10-CM | POA: Insufficient documentation

## 2021-02-04 DIAGNOSIS — I4819 Other persistent atrial fibrillation: Secondary | ICD-10-CM | POA: Insufficient documentation

## 2021-02-04 LAB — BASIC METABOLIC PANEL
BUN/Creatinine Ratio: 14 (ref 12–28)
BUN: 14 mg/dL (ref 8–27)
CO2: 19 mmol/L — ABNORMAL LOW (ref 20–29)
Calcium: 9 mg/dL (ref 8.7–10.3)
Chloride: 103 mmol/L (ref 96–106)
Creatinine, Ser: 0.98 mg/dL (ref 0.57–1.00)
Glucose: 71 mg/dL (ref 65–99)
Potassium: 4 mmol/L (ref 3.5–5.2)
Sodium: 139 mmol/L (ref 134–144)
eGFR: 61 mL/min/{1.73_m2} (ref >59)

## 2021-02-04 LAB — ECHOCARDIOGRAM LIMITED
Area-P 1/2: 2.19 cm2
MV M vel: 5.58 m/s
MV Peak grad: 124.5 mmHg
MV VTI: 1.46 cm2
S' Lateral: 2.5 cm

## 2021-02-06 ENCOUNTER — Other Ambulatory Visit (HOSPITAL_COMMUNITY)
Admission: RE | Admit: 2021-02-06 | Discharge: 2021-02-06 | Disposition: A | Discharge status: Home / Self Care | Payer: Medicare Other | Source: Ambulatory Visit | Attending: Physician Assistant | Admitting: Physician Assistant

## 2021-02-06 DIAGNOSIS — Z20822 Contact with and (suspected) exposure to covid-19: Secondary | ICD-10-CM | POA: Diagnosis not present

## 2021-02-06 DIAGNOSIS — Z01812 Encounter for preprocedural laboratory examination: Secondary | ICD-10-CM | POA: Insufficient documentation

## 2021-02-07 LAB — SARS CORONAVIRUS 2 (TAT 6-24 HRS): SARS Coronavirus 2: NEGATIVE

## 2021-02-09 ENCOUNTER — Ambulatory Visit (INDEPENDENT_AMBULATORY_CARE_PROVIDER_SITE_OTHER): Payer: Medicare Other | Admitting: Internal Medicine

## 2021-02-09 ENCOUNTER — Other Ambulatory Visit: Payer: Self-pay

## 2021-02-09 DIAGNOSIS — I4819 Other persistent atrial fibrillation: Secondary | ICD-10-CM

## 2021-02-09 DIAGNOSIS — I5020 Unspecified systolic (congestive) heart failure: Secondary | ICD-10-CM

## 2021-02-09 DIAGNOSIS — I10 Essential (primary) hypertension: Secondary | ICD-10-CM

## 2021-02-09 LAB — PULMONARY FUNCTION TEST
DL/VA % pred: 81 %
DL/VA: 3.39 ml/min/mmHg/L
DLCO unc % pred: 72 %
DLCO unc: 13.27 ml/min/mmHg
FEF 25-75 Post: 2.35 L/sec
FEF 25-75 Pre: 1.53 L/sec
FEF2575-%Change-Post: 53 %
FEF2575-%Pred-Post: 144 %
FEF2575-%Pred-Pre: 94 %
FEV1-%Change-Post: 8 %
FEV1-%Pred-Post: 96 %
FEV1-%Pred-Pre: 88 %
FEV1-Post: 1.93 L
FEV1-Pre: 1.78 L
FEV1FVC-%Change-Post: 2 %
FEV1FVC-%Pred-Pre: 104 %
FEV6-%Change-Post: 7 %
FEV6-%Pred-Post: 95 %
FEV6-%Pred-Pre: 88 %
FEV6-Post: 2.41 L
FEV6-Pre: 2.25 L
FEV6FVC-%Change-Post: 0 %
FEV6FVC-%Pred-Post: 105 %
FEV6FVC-%Pred-Pre: 104 %
FVC-%Change-Post: 6 %
FVC-%Pred-Post: 90 %
FVC-%Pred-Pre: 85 %
FVC-Post: 2.41 L
FVC-Pre: 2.27 L
Post FEV1/FVC ratio: 80 %
Post FEV6/FVC ratio: 100 %
Pre FEV1/FVC ratio: 78 %
Pre FEV6/FVC Ratio: 99 %
RV % pred: 81 %
RV: 1.78 L
TLC % pred: 99 %
TLC: 4.78 L

## 2021-02-09 NOTE — Progress Notes (Signed)
PFT done today. 

## 2021-02-12 ENCOUNTER — Other Ambulatory Visit: Payer: Self-pay

## 2021-02-12 ENCOUNTER — Ambulatory Visit (INDEPENDENT_AMBULATORY_CARE_PROVIDER_SITE_OTHER): Payer: Medicare Other | Admitting: Pharmacist

## 2021-02-12 VITALS — BP 180/82 | HR 56 | Wt 217.0 lb

## 2021-02-12 DIAGNOSIS — I5020 Unspecified systolic (congestive) heart failure: Secondary | ICD-10-CM | POA: Diagnosis not present

## 2021-02-12 DIAGNOSIS — M1610 Unilateral primary osteoarthritis, unspecified hip: Secondary | ICD-10-CM | POA: Insufficient documentation

## 2021-02-12 DIAGNOSIS — E782 Mixed hyperlipidemia: Secondary | ICD-10-CM | POA: Insufficient documentation

## 2021-02-12 DIAGNOSIS — Z8639 Personal history of other endocrine, nutritional and metabolic disease: Secondary | ICD-10-CM | POA: Insufficient documentation

## 2021-02-12 DIAGNOSIS — I10 Essential (primary) hypertension: Secondary | ICD-10-CM | POA: Insufficient documentation

## 2021-02-12 MED ORDER — SPIRONOLACTONE 25 MG PO TABS: ORAL_TABLET | ORAL | 3 refills | Status: DC

## 2021-02-12 NOTE — Patient Instructions (Addendum)
It was nice meeting you today!  We would like your blood pressure to be less than 130/80  Please continue your metoprolol 12.5mg  twice a day and your valsartan 80 mg twice a day  We will start a new medication called spironolactone.  Take 1/2 tablet (12.5 mg) once a day once you come [place] from the [place].  We will check your lab work one week afterwards.  Please give [place] a call with any questions!  Karren Cobble, PharmD, BCACP, [place], [place] [address] [place], [place] [address] Phone: [phone]; Fax: [phone] 02/12/2021 12:03 PM

## 2021-02-12 NOTE — Progress Notes (Signed)
Patient ID: Michelle Watkins                 DOB: Apr 18, 1946                      MRN: [id number]     HPI: Michelle Watkins is a 75 y.o. female referred by Richardson Dopp to HTN clinic. PMH is significant for A Fib (on amiodarone), CHF (last EF 69%), HTN, and hypothyroidism.  Patient is followed by Roderic Palau in A Fib clinic and has been seeing Richardson Dopp for cardiology.  Patient underwent cardioversion in December 2021 in [place] and started on amiodarone.  Blood pressure has subsequently been increasing although patient has been maintaining normal sinus rhythm.  Currently wearing 14  Day Zio patch.  Was previously started on amlodipine which caused lower extremity edema and was switched to valsartan.  Patient has had no adverse effects to valsartan however systolic BP remains elevated.  Swelling in legs/feet has gone down.  Has furosemide at home but only takes PRN.  Has not needed since last December.  Patient presents today in good spirits.  Has many questions regarding amiodarone and possible adverse effects.  Has current PFTs and TSH on file.  Takes BP frequently throughout the day, uses Omron cuff on left arm.  Had patient take BP in room: 193/85.  Home BP log:  149/74 154/76 172/105 181/84 158/76 163/81 152/74 162/76 159/66 174/87 160/75  Pulse rates range from 50-61  Has a condo in [place] in the mountains and travels there frequently.  Will be leaving this Tuesday 5/24 and be gone for a week.  Current HTN meds: furosemide 20mg  PRN, metoprolol 12.5 mg BID, valsartan 80 mg BID Previously tried: amlodipine, lisinopril BP goal: <130/80  Wt Readings from Last 3 Encounters:  01/06/21 216 lb 3.2 oz (98.1 kg)  11/19/20 214 lb (97.1 kg)  10/10/20 211 lb 9.6 oz (96 kg)   BP Readings from Last 3 Encounters:  01/24/21 (!) 148/62  01/06/21 132/72  11/19/20 140/80   Pulse Readings from Last 3 Encounters:  01/24/21 (!) 50  01/06/21 63  11/19/20 (!) 59     Renal function: CrCl cannot be calculated (Unknown ideal weight.).  Past Medical History:  Diagnosis Date  . HFrEF (heart failure with reduced ejection fraction) (HCC)    EF 30-35, global HK, low normal RVSF, RVSP 39.6, mild RAE, mod MR // Echo 5/22: EF 69, no RWMA, normal RVSF, mild LAE, trivial MR  . Hypertension   . Persistent atrial fibrillation (HCC)    Amio Rx//Apixaban Rx  . Thyroid disease     Current Outpatient Medications on File Prior to Visit  Medication Sig Dispense Refill  . acetaminophen (TYLENOL) 650 MG CR tablet Take 650 mg by mouth every 8 (eight) hours as needed (for pain).    Marland Kitchen amiodarone (PACERONE) 200 MG tablet Take 200mg   - 1 tablet by mouth Monday - Friday and take 100mg  - 1/2 tablet by mouth on Saturday and Sunday 90 tablet 3  . apixaban (ELIQUIS) 5 MG TABS tablet Take 1 tablet (5 mg total) by mouth 2 (two) times daily. 180 tablet 3  . furosemide (LASIX) 20 MG tablet Take 1 tablet by mouth daily for 3 days then only as needed for fluid retention (Patient taking differently: Take 20 mg by mouth daily as needed ("FOR FLUID RETENTION").) 10 tablet 0  . levothyroxine (SYNTHROID) 125 MCG tablet Take 125 mcg by mouth daily  before breakfast.    . loratadine (CLARITIN) 10 MG tablet Take 10 mg by mouth daily as needed for allergies.    . metoprolol tartrate (LOPRESSOR) 25 MG tablet Take 1 tablet (25 mg total) by mouth 2 (two) times daily. (Patient taking differently: Take 12.5 mg by mouth 2 (two) times daily.) 270 tablet 3  . SYNTHROID 125 MCG tablet Take 125 mcg by mouth daily before breakfast.    . valsartan (DIOVAN) 80 MG tablet Take 1 tablet (80 mg total) by mouth 2 (two) times daily. 90 tablet 3   No current facility-administered medications on file prior to visit.    Allergies  Allergen Reactions  . Penicillins Rash  . Sulfamethoxazole Rash     Assessment/Plan:  1. Hypertension - Patient BP in room 180/82 which is above goal of <130/80 and similar to  patient's home cuff reading in room.  Patient demonstrated correct technique in room and cuff appears accurate.  Answered patient's questions regarding amiodarone and how we try to minimize adverse effects with tests such as PFTs and thyroid tests and try to avoid interacting medications.  This made patient more comfortable.  Still needs further systolic BP lowering however and unfortunately valsartan has not gotten her to goal despite dose increase.  Discussed next options with patient such as increasing valsartan, switching to Lewis, or adding an agent with different mechanism of action such as spiro.  Do not want to adjust metoprolol at this time due to patients pulse rate.  Will choose spironolactone at this time due to benefits in CHF and because patient has not tried yet.  Patient leaving [place] next [place] so will not be able to check lab work.  Recommended patient pick up medication later next week and then will check lab work 1 week later.  Patient voiced understanding.  Has appt with [name] on 6/6.  Continue valsartan 80mg  BID Continue metoprolol 12.5mg  BID Start spironolactone 12.5mg  once daily Recheck BMP 1 week after starting medication  Karren Cobble, PharmD, BCACP, CDCES, [place] [address], [place], [place] [address] Phone: [phone]; Fax: [phone] 02/12/2021 12:53 PM

## 2021-02-16 ENCOUNTER — Telehealth: Payer: Self-pay | Admitting: Pharmacist

## 2021-02-16 DIAGNOSIS — I5020 Unspecified systolic (congestive) heart failure: Secondary | ICD-10-CM

## 2021-02-16 NOTE — Telephone Encounter (Signed)
Patient called with concerns regarding BP.  Had recommended patient start spironolactone 12.5mg  daily on 5/24 since she is going out of town for 1 week and then could have lab work when she returned.  However patient started spironolactone on 5/21.  BP readings have been slowly increasing since then:  147/66 149/77 159/77 163/81 163/77 175/84 177/80  Had discussed in appointment other options such as increasing valsartan.  However since patient started spiro early advised her to d/c at this time since she is leaving town tomorrow and continue to check BP while away.  Will discuss other options or restart spiro when she returns on 5/31.

## 2021-02-16 NOTE — Telephone Encounter (Signed)
Pt c/o medication issue: 1. Name of Medication: spironolactone  2. How are you currently taking this medication (dosage and times per day)? 1/2 tab in he morning 3. Are you having a reaction (difficulty breathing--STAT)?  No  4. What is your medication issue? Patient states she started this medication on the 02/14/21 and states that is not working because her BP seems to be going up.

## 2021-02-17 ENCOUNTER — Emergency Department (HOSPITAL_COMMUNITY)
Admission: EM | Admit: 2021-02-17 | Discharge: 2021-02-17 | Disposition: A | Discharge status: Home / Self Care | Payer: Medicare Other | Attending: Emergency Medicine | Admitting: Emergency Medicine

## 2021-02-17 ENCOUNTER — Other Ambulatory Visit: Payer: Self-pay

## 2021-02-17 ENCOUNTER — Encounter (HOSPITAL_COMMUNITY): Payer: Self-pay | Admitting: Emergency Medicine

## 2021-02-17 DIAGNOSIS — Z5321 Procedure and treatment not carried out due to patient leaving prior to being seen by health care provider: Secondary | ICD-10-CM | POA: Diagnosis not present

## 2021-02-17 DIAGNOSIS — R030 Elevated blood-pressure reading, without diagnosis of hypertension: Secondary | ICD-10-CM | POA: Diagnosis not present

## 2021-02-17 NOTE — ED Triage Notes (Signed)
Pt reports taking her BP and it was 200s/100. States she took her BP medications early and BP came down some, states she has had recent changes to her medications. A&O x 4, denies chest pain, or shortness of breath at this time.

## 2021-02-17 NOTE — ED Notes (Signed)
Pt left due to not being seen quick enough 

## 2021-02-17 NOTE — Telephone Encounter (Signed)
Patient calling back. She states her BP is higher than yesterday. She states the highest it was today was 210/96 at 6:58am this morning. She states when she first woke up she did not feel well and it was 171/89 at 6:48 am. She states she usually takes her medication at 9:00 am, but took it early because her BP was so high. She states it brought it down to 180/96 and the lowest was 156/78, but it is trending up again. She states she is concerned about what to do it her BP jumps up again and what to do tonight. She states she does feel better than this morning.

## 2021-02-17 NOTE — ED Provider Notes (Signed)
Emergency Medicine Provider Triage Evaluation Note  Michelle Watkins , a 75 y.o. female  was evaluated in triage.  Pt complains of high blood pressure.  Review of Systems  Positive: High blood pressure, single, fleeting instance of breath Negative: Headache, dizziness, syncope, chest discomfort  Physical Exam  BP (!) 181/70 (BP Location: Left Arm)   Pulse (!) 58   Temp 98.6 F (37 C) (Oral)   Resp 16   SpO2 99%  Gen:   Awake, no distress   Resp:  Normal effort  MSK:   Moves extremities without difficulty  Other:    Medical Decision Making  Medically screening exam initiated at 5:05 PM.  Appropriate orders placed.  Claude Swendsen was informed that the remainder of the evaluation will be completed by another provider, this initial triage assessment does not replace that evaluation, and the importance of remaining in the ED until their evaluation is complete.     Lorayne Bender, PA-C 02/17/21 1738    Drenda Freeze, MD 02/18/21 Adelfa Koh

## 2021-02-17 NOTE — Telephone Encounter (Signed)
Called patient and left message on machine.  Asked for call back

## 2021-02-18 MED ORDER — HYDRALAZINE HCL 25 MG PO TABS: ORAL_TABLET | ORAL | 1 refills | Status: DC

## 2021-02-18 NOTE — Telephone Encounter (Signed)
Patient called back.  Her blood pressure elevated yesterday >200 and went to ER.  Blood pressure continued to range between 170 to 200.  ER physician advised for her to not take her BP as often as she was since it may have been causing her anxiety.  Was supposed to go out of town but she canceled her trip due to elevated BP.  Had not discontinued spironolactone.  Due to elevated BP, will start patient on hydralazine 25mg  TID for BP >150/90.  Rx sent to pharmacy.  Has lab work scheduled for 6/1 and appt with [name] on 6/6

## 2021-02-18 NOTE — Addendum Note (Signed)
Addended by: Rollen Sox on: 02/18/2021 11:35 AM   Modules accepted: Orders

## 2021-02-25 ENCOUNTER — Other Ambulatory Visit: Payer: Self-pay

## 2021-02-25 ENCOUNTER — Other Ambulatory Visit: Payer: Medicare Other

## 2021-02-25 DIAGNOSIS — I5020 Unspecified systolic (congestive) heart failure: Secondary | ICD-10-CM

## 2021-02-25 DIAGNOSIS — I10 Essential (primary) hypertension: Secondary | ICD-10-CM

## 2021-02-26 ENCOUNTER — Telehealth: Payer: Self-pay | Admitting: Pharmacist

## 2021-02-26 LAB — BASIC METABOLIC PANEL
BUN/Creatinine Ratio: 17 (ref 12–28)
BUN: 16 mg/dL (ref 8–27)
CO2: 20 mmol/L (ref 20–29)
Calcium: 9.4 mg/dL (ref 8.7–10.3)
Chloride: 104 mmol/L (ref 96–106)
Creatinine, Ser: 0.96 mg/dL (ref 0.57–1.00)
Glucose: 99 mg/dL (ref 65–99)
Potassium: 4.8 mmol/L (ref 3.5–5.2)
Sodium: 140 mmol/L (ref 134–144)
eGFR: 62 mL/min/{1.73_m2} (ref >59)

## 2021-02-26 NOTE — Telephone Encounter (Signed)
Spoke with patient and relayed normal lab results.  BP has come down, has not needed hydralazine often.  Has appt 6/6

## 2021-03-02 ENCOUNTER — Ambulatory Visit: Payer: Medicare Other | Admitting: Family

## 2021-03-02 ENCOUNTER — Encounter: Payer: Self-pay | Admitting: *Deleted

## 2021-03-02 ENCOUNTER — Encounter: Payer: Self-pay | Admitting: Family

## 2021-03-02 ENCOUNTER — Other Ambulatory Visit: Payer: Self-pay

## 2021-03-02 VITALS — BP 168/78 | HR 56 | Ht 63.0 in | Wt 216.6 lb

## 2021-03-02 DIAGNOSIS — I4819 Other persistent atrial fibrillation: Secondary | ICD-10-CM

## 2021-03-02 DIAGNOSIS — Z7901 Long term (current) use of anticoagulants: Secondary | ICD-10-CM

## 2021-03-02 DIAGNOSIS — I10 Essential (primary) hypertension: Secondary | ICD-10-CM | POA: Diagnosis not present

## 2021-03-02 DIAGNOSIS — I5020 Unspecified systolic (congestive) heart failure: Secondary | ICD-10-CM | POA: Diagnosis not present

## 2021-03-02 DIAGNOSIS — R001 Bradycardia, unspecified: Secondary | ICD-10-CM

## 2021-03-02 DIAGNOSIS — I471 Supraventricular tachycardia: Secondary | ICD-10-CM

## 2021-03-02 DIAGNOSIS — Z79899 Other long term (current) drug therapy: Secondary | ICD-10-CM

## 2021-03-02 MED ORDER — SPIRONOLACTONE 25 MG PO TABS: 25.0000 mg | ORAL_TABLET | Freq: Every day | ORAL | 5 refills | Status: DC

## 2021-03-02 NOTE — Patient Instructions (Addendum)
Medication Instructions:  Your provider has recommended you make the following change in your medication:  CHANGE Spironolactone 25mg  daily   *If you need a refill on your cardiac medications before your next appointment, please call your pharmacy*   Lab Work: Your provider recommends that you return for lab work next week for [facility] on Tuesday, June 14 between 7:30 - 4:30.   If you have labs (blood work) drawn today and your tests are completely normal, you will receive your results only by: Marland Kitchen MyChart Message (if you have MyChart) OR . A paper copy in the mail If you have any lab test that is abnormal or we need to change your treatment, we will call you to review the results.   Testing/Procedures: Your EKG today shows sinus bradycardia which is a stab   Follow-Up: At [facility], you and your health needs are our priority.  As part of our continuing mission to provide you with exceptional heart care, we have created designated Provider Care Teams.  These Care Teams include your primary Cardiologist (physician) and Advanced Practice Providers (APPs -  Physician Assistants and Nurse Practitioners) who all work together to provide you with the care you need, when you need it.  We recommend signing up for the patient portal called "MyChart".  Sign up information is provided on this After Visit Summary.  MyChart is used to connect with patients for Virtual Visits (Telemedicine).  Patients are able to view lab/test results, encounter notes, upcoming appointments, etc.  Non-urgent messages can be sent to your provider as well.   To learn more about what you can do with MyChart, go to [email or web address].    Your next appointment:   In August with Richardson Dopp, PA   Other Instructions  Heart Healthy Diet Recommendations: A low-salt diet is recommended. Meats should be grilled, baked, or boiled. Avoid fried foods. Focus on lean protein sources like fish or chicken with vegetables and  fruits. The American Heart Association is a Microbiologist!  American Heart Association Diet and Lifeystyle Recommendations   Exercise recommendations: The American Heart Association recommends 150 minutes of moderate intensity exercise weekly. Try 30 minutes of moderate intensity exercise 4-5 times per week. This could include walking, jogging, or swimming.

## 2021-03-02 NOTE — Telephone Encounter (Signed)
There seemed to be some confusion about the pt being set up for [facility] Sleep Study. After speaking with [name], NP who saw the pt today in the office and the pt stating that no one has contacted her to let her know ok to open the sleep study box. There had seemed to no order was placed and the sleep pool/staff were not contacted. The [employer] seems to have been entered into the pt's chart, though the pt was never registered as was not fully communicated to sleep pool/staff. Per [name], NP she place the order for the sleep study. I will call the pt and confirm the information that was on the [place]. I will need to ask the pt if she is able to measure her neck or if someone may be able to assist in that for her. I will need her neck circumference in order to register the pt with [facility].   I have left a message tonight for the pt that I was calling in regards to the [facility] Sleep study. Left message that I am out of the office tomorrow Tuesday 03/03/21, though I will call her back on Wed 6/8 to obtain the information that I need in order to proceed with registration for [facility] sleep study.

## 2021-03-02 NOTE — Progress Notes (Signed)
Office Visit    Patient Name: Michelle Watkins Date of Encounter: 03/02/2021  PCP:  Orpah Melter, [facility]  Cardiologist:  Sherren Mocha, MD  Advanced Practice Provider:  Liliane Shi, PA-C Electrophysiologist:  None   Chief Complaint    Michelle Watkins is a 75 y.o. 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 HFrEF (heart failure with reduced ejection fraction) (HCC)    EF 30-35, global HK, low normal RVSF, RVSP 39.6, mild RAE, mod MR // Echo 5/22: EF 69, no RWMA, normal RVSF, mild LAE, trivial MR  . Hypertension   . Persistent atrial fibrillation (HCC)    Amio Rx//Apixaban Rx  . Thyroid disease    Past Surgical History:  Procedure Laterality Date  . APPENDECTOMY    . CARDIOVERSION N/A 08/15/2020   Procedure: CARDIOVERSION;  Surgeon: Sanda Klein, MD;  Location: [facility];  Service: Cardiovascular;  Laterality: N/A;  . HYSTERECTOMY ABDOMINAL WITH SALPINGECTOMY      Allergies  Allergies  Allergen Reactions  . Amlodipine Swelling    LEE  . Penicillins Rash  . Sulfamethoxazole Rash    History of Present Illness    Michelle Watkins is a 75 y.o. 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  She had previous cardioversion December 2021 in [place] and was started on Amiodarone. Her BP was noted to subsequently increase. She was symptomatic with atrial fibrillation with tachycardia and echo 07/2020 LVEF 30-35%. Repeat echocardiogram 02/04/21 in normal sinus rhythm with LVEF 69%, RV normal size and function, normal PASP, LA mildly dilated,  trivial MR.   Previous intolerance to Amlodipine with lower extremity edema. She was transitioned to Valsartan which was gradually titrated to 80mg  twice daily. She did note episodes of weakness and flushing with heart rate in the 40s. Her Amiodarone was down-titrated to 200mg  one tablet daily except Saturday and Sunday take 100mg  (half tablet). 14 day ZIO monitor was mailed to her home. Due to persistently elevated blood pressure readings she was recommended to follow up with pharmacist.   When last seen 02/12/21 by Karren Cobble, [place] she was started on Spironolactone 12.5mg  daily. Via phone 02/16/21 she was started on Hydralazine 25mg  TID for BP >150/90 after ED visit for elevated blood pressure reading. It was thought that her elevated BP was also related to anxiety regarding checking BP frequently and she was encouraged to check only once or twice per day.   She presents today for follow up. On initial check there was marked difference between her home cuff and our manual cuff. However, initial BP taken with large cuff which was too large for her arm likely leading to falsely lowered reading. Repeat BP with her automatic arm cuff and our normal adult cuff were similar.   Her home blood pressure is most often 140s/70s with occasional reading into the 160s. No episodes of hypotension. Reports no shortness of breath nor dyspnea on exertion. Reports no chest pain, pressure, or tightness. No edema, orthopnea, PND. Reports no palpitations. We reviewed the preliminary report of her monitor showing predominantly normal sinus rhythm with average heart rate of 57 bpm, 92 runs of SVT  which were not triggered, and no significant pauses. She has taken her as-needed hydralazine 5 times since it was prescribed 02/16/21. Tells me Hydralazine makes her feel "terrible" tells me she feels funny in her stomach gets a headache.  She reports no formal exercise routine but is planning to start walking. She [place]   EKGs/Labs/Other Studies Reviewed:   The following studies were reviewed today:   EKG:  EKG is  ordered today.  The ekg ordered today demonstrates sinus bradycardia 56 bpm with no acute ST/T wave changes.  Recent Labs: 10/10/2020: TSH 3.858 01/20/2021: ALT 30 01/24/2021: Hemoglobin 13.9; Platelets 315 02/25/2021: BUN 16; Creatinine, Ser 0.96; Potassium 4.8; Sodium 140  Recent Lipid Panel No results found for: CHOL, TRIG, HDL, CHOLHDL, VLDL, LDLCALC, LDLDIRECT  Home Medications   Current Meds  Medication Sig  . acetaminophen (TYLENOL) 650 MG CR tablet Take 650 mg by mouth every 8 (eight) hours as needed (for pain).  Marland Kitchen amiodarone (PACERONE) 200 MG tablet Take 200mg   - 1 tablet by mouth Monday - Friday and take 100mg  - 1/2 tablet by mouth on Saturday and Sunday  . apixaban (ELIQUIS) 5 MG TABS tablet Take 1 tablet (5 mg total) by mouth 2 (two) times daily.  . furosemide (LASIX) 20 MG tablet Take 1 tablet by mouth daily for 3 days then only as needed for fluid retention  . hydrALAZINE (APRESOLINE) 25 MG tablet Take 1 tablet by mouth three times a day for blood pressure greater than 150/90  . loratadine (CLARITIN) 10 MG tablet Take 10 mg by mouth daily as needed for allergies.  . metoprolol tartrate (LOPRESSOR) 25 MG tablet Take 25 mg by mouth 2 (two) times daily. Per patient taking 1/2 tablet in the morning and 1/2 tablet at night  . SYNTHROID 125 MCG tablet Take 125 mcg by mouth daily before breakfast.  . valsartan (DIOVAN) 80 MG tablet Take 1 tablet (80 mg total) by mouth 2 (two) times daily.  . [DISCONTINUED] spironolactone (ALDACTONE) 25 MG tablet Take 1/2 tablet (12.5mg ) once a day in the morning starting 5/25     Review of Systems   All other systems reviewed and are otherwise negative except as noted above.  Physical Exam    VS:  BP (!) 168/78   Pulse (!) 56   Ht 5\' 3"  (1.6 m)   Wt 216 lb 9.6 oz (98.2 kg)   SpO2 95%    BMI 38.37 kg/m  , BMI Body mass index is 38.37 kg/m.  Wt Readings from Last 3 Encounters:  03/02/21 216 lb 9.6 oz (98.2 kg)  02/12/21 217 lb (98.4 kg)  01/06/21 216 lb 3.2 oz (98.1 kg)    GEN: Well nourished, well developed, in no acute distress. HEENT: normal. Neck: Supple, no JVD, carotid bruits, or masses. Cardiac: bradycardic, RRR, no murmurs, rubs, or gallops. No clubbing, cyanosis, edema.  Radials/DP/PT 2+ and equal bilaterally.  Respiratory:  Respirations regular and unlabored, clear to auscultation bilaterally. GI: Soft, nontender, nondistended. MS: No deformity or atrophy. Skin: Warm and dry, no rash. Neuro:  Strength and sensation are intact. Psych: Normal affect.  Assessment & Plan    1. HTN - BP remains elevated average 140s/70s at home but improved since addition of Spironolactone. Using PRN Hydralazine sparingly and notes she often feels nauseous when she takes it. Will try to get her blood pressure under better control so PRN Hydralazine  will not be needed. Home BP cuff today provides similar readings to manual normal-sized arm blood pressure cuff. Continue Metoprolol Tartrate 12.5mg  twice daily, will not increase dose due to baseline bradycardia. As good response to Spironolactone 12.5mg , increase to Spironolactone 25mg  QD with repeat BMP in 1 week. Continue Valsartan 80mg  twice daily - could be uptitrated in the future as needed. Continue PRN Hydralazine for SBP >150/90, if she continues to note upset stomach when taking could trial lower dose vs alternative agent. Goal is to get her daily BP control to goal of <130/80 so as to not need PRN agent. Encouraged to check BP once or twice per day after resting 10 minutes, but not more frequently.   2. PAF / Chronic anticoagulation / On Amiodarone therapy - Maintaining NSR. Denies bleeding complications on Eliquis 5mg  BID. Does not meet dose reduction criteria. Recent liver enzymes and TSH unremarkable. No signs of Amiodarone  toxicity. Continue present dosing - of note, dose reduced on Saturday and Sunday due to previously reported bradycardic rates in the 40s.   3. Bradycardia - Asymptomatic with no lightheadedness, dizziness, near syncope, syncope. Defer escalation of dose of AV nodal blocking agents such as Metoprolol. Would avoid addition of CCB for BP control.   4. HFrEF with normalization of LVEF - tachycardia mediated cardiomyopathy in setting of atrial fib. Echo 01/2021 with normalized LVEF with restoration of NSR. No edema, orthopnea, PND. PFT performed 02/09/21. GDMT includes Valsartan, Metoprolol, Spironolactone, PRN Lasix.   5. SVT - Noted on preliminary ZIO report. Episodes were not triggered. Denies palpitations. Recent electrolytes normal. TSH monitored by PCP in setting of hypothyroidism. Will not increase Metoprolol as she is aysmptomatic and bradycardic resting heart rate.   Disposition: Follow up in 2 month(s) with Dr. Burt Knack or APP.    Signed, Loel Dubonnet, NP 03/02/2021, 9:30 PM [place]

## 2021-03-03 ENCOUNTER — Encounter: Payer: Self-pay | Admitting: Physician Assistant

## 2021-03-03 ENCOUNTER — Encounter: Payer: Self-pay | Admitting: *Deleted

## 2021-03-04 NOTE — Telephone Encounter (Signed)
After speaking with my Clinic Supervisor this morning, it was discovered that this is the incorrect pt in regards to the notes about [facility] Sleep Study. I will make this an erroneous entry into the chart.   This encounter was created in error - please disregard.

## 2021-03-10 ENCOUNTER — Other Ambulatory Visit: Payer: Self-pay

## 2021-03-10 ENCOUNTER — Other Ambulatory Visit: Payer: Medicare Other | Admitting: *Deleted

## 2021-03-10 DIAGNOSIS — Z7901 Long term (current) use of anticoagulants: Secondary | ICD-10-CM

## 2021-03-10 DIAGNOSIS — I4819 Other persistent atrial fibrillation: Secondary | ICD-10-CM

## 2021-03-10 DIAGNOSIS — I10 Essential (primary) hypertension: Secondary | ICD-10-CM

## 2021-03-10 DIAGNOSIS — I5020 Unspecified systolic (congestive) heart failure: Secondary | ICD-10-CM

## 2021-03-11 LAB — BASIC METABOLIC PANEL
BUN/Creatinine Ratio: 20 (ref 12–28)
BUN: 18 mg/dL (ref 8–27)
CO2: 23 mmol/L (ref 20–29)
Calcium: 9.4 mg/dL (ref 8.7–10.3)
Chloride: 103 mmol/L (ref 96–106)
Creatinine, Ser: 0.9 mg/dL (ref 0.57–1.00)
Glucose: 78 mg/dL (ref 65–99)
Potassium: 4.8 mmol/L (ref 3.5–5.2)
Sodium: 139 mmol/L (ref 134–144)
eGFR: 67 mL/min/{1.73_m2} (ref >59)

## 2021-04-13 ENCOUNTER — Other Ambulatory Visit: Payer: Self-pay | Admitting: Cardiovascular Disease

## 2021-05-05 ENCOUNTER — Encounter: Payer: Self-pay | Admitting: Physician Assistant

## 2021-05-05 ENCOUNTER — Other Ambulatory Visit: Payer: Self-pay

## 2021-05-05 ENCOUNTER — Ambulatory Visit: Payer: Medicare Other | Admitting: Physician Assistant

## 2021-05-05 VITALS — BP 124/64 | HR 60 | Ht 63.0 in | Wt 218.4 lb

## 2021-05-05 DIAGNOSIS — I4819 Other persistent atrial fibrillation: Secondary | ICD-10-CM

## 2021-05-05 DIAGNOSIS — I10 Essential (primary) hypertension: Secondary | ICD-10-CM | POA: Diagnosis not present

## 2021-05-05 DIAGNOSIS — I5032 Chronic diastolic (congestive) heart failure: Secondary | ICD-10-CM | POA: Diagnosis not present

## 2021-05-05 NOTE — Patient Instructions (Signed)

## 2021-05-05 NOTE — Progress Notes (Signed)
Cardiology Office Note:    Date:  05/05/2021   ID:  Michelle Watkins, DOB May 07, 1946, MRN [id number]  PCP:  Orpah Melter, MD   [facility] Providers Cardiologist:  Sherren Mocha, MD Cardiology APP:  Sharmon Revere     Referring MD: Orpah Melter, MD   Chief Complaint:  Follow-up (Atrial fibrillation, CHF, HTN)    Patient Profile:    Michelle Watkins is a 75 y.o. female with:  Persistent Atrial fibrillation S/p DCCV 07/2020 Admx in [place], [place] w AF w RVR in 08/2020>>Amio/DCCV HFimpEF (heart failure with improved ejection fraction)  Tachycardia mediated cardiomyopathy Echo 11/21: EF 30-35 Echo 5/22: EF 69 Hypertension Hypothyroidism    Prior CV studies: [place] 02/17/2021 Summary:  The basic rhythm is normal sinus with an average HR of 57 bpm  Supraventricular and ventricular ectopics are rare (<1% burden)  There are short supraventricular runs present, without sustained SVT   Echocardiogram 02/04/2021 EF 69, no RWMA, normal RVSF, normal PASP, mild LAE, trivial MR.   Echocardiogram 08/13/20 EF 30-35, global HK, low normal RVSF, RVSP 39.6, mild RAE, mod MR   History of Present Illness: [name] had a follow-up echo in May 2022 which demonstrated that her LV function had returned to normal.  A follow-up event monitor also demonstrated no evidence of atrial fibrillation.  She was last seen in clinic by [name], NP in 6/22 for f/u on BP.  Her blood pressure remained above target and spironolactone was increased.  Of note, the patient was having side effects to as needed hydralazine.  She returns for follow-up.  She is here alone.  Overall, she has been doing well without chest discomfort, shortness of breath, syncope, orthopnea, significant leg edema.  She does have some dependent edema from time to time when she eats too much salt.    Past Medical History:  Diagnosis Date   HFrEF (heart failure with  reduced ejection fraction) (HCC)    EF 30-35, global HK, low normal RVSF, RVSP 39.6, mild RAE, mod MR // Echo 5/22: EF 69, no RWMA, normal RVSF, mild LAE, trivial MR   Hypertension    Persistent atrial fibrillation ([place])    Amio Rx//Apixaban Rx // ZIO monitor 6/22: NSR, average heart rate 57; no pauses or atrial fibrillation; short SVT runs without sustained arrhythmia; PACs/PVCs (<1%)    Thyroid disease     Current Medications: Current Meds  Medication Sig   acetaminophen (TYLENOL) 650 MG CR tablet Take 650 mg by mouth every 8 (eight) hours as needed (for pain).   amiodarone (PACERONE) 200 MG tablet Take '200mg'$   - 1 tablet by mouth Monday - Friday and take '100mg'$  - 1/2 tablet by mouth on Saturday and Sunday   apixaban (ELIQUIS) 5 MG TABS tablet Take 1 tablet (5 mg total) by mouth 2 (two) times daily.   furosemide (LASIX) 20 MG tablet Take 1 tablet by mouth daily for 3 days then only as needed for fluid retention   hydrALAZINE (APRESOLINE) 25 MG tablet TAKE 1 TABLET BY MOUTH THREE TIMES A DAY FOR BLOOD PRESSURE GREATER THAN 150/90   loratadine (CLARITIN) 10 MG tablet Take 10 mg by mouth daily as needed for allergies.   metoprolol tartrate (LOPRESSOR) 25 MG tablet Take 25 mg by mouth 2 (two) times daily. Per patient taking 1/2 tablet in the morning and 1/2 tablet at night   spironolactone (ALDACTONE) 25 MG tablet Take 1 tablet (25 mg total) by mouth daily.  SYNTHROID 125 MCG tablet Take 125 mcg by mouth daily before breakfast.   valsartan (DIOVAN) 80 MG tablet Take 1 tablet (80 mg total) by mouth 2 (two) times daily.     Allergies:   Amlodipine, Penicillins, and Sulfamethoxazole   Social History   Tobacco Use   Smoking status: Never   Smokeless tobacco: Never  Substance Use Topics   Alcohol use: Never   Drug use: Never     Family Hx: The patient's family history is not on file.  Review of Systems  Gastrointestinal:  Negative for hematochezia.  Genitourinary:  Negative for  hematuria.    EKGs/Labs/Other Test Reviewed:    EKG:  EKG is not ordered today.  The ekg ordered today demonstrates N/A  Recent Labs: 10/10/2020: TSH 3.858 01/20/2021: ALT 30 01/24/2021: Hemoglobin 13.9; Platelets 315 03/10/2021: BUN 18; Creatinine, Ser 0.90; Potassium 4.8; Sodium 139   Recent Lipid Panel No results found for: CHOL, TRIG, HDL, LDLCALC, LDLDIRECT    Risk Assessment/Calculations:    CHA2DS2-VASc Score = 4  This indicates a 4.8% annual risk of stroke. The patient's score is based upon: CHF History: Yes HTN History: Yes Diabetes History: No Stroke History: No Vascular Disease History: No Age Score: 1 Gender Score: 1     Physical Exam:    VS:  BP 124/64   Pulse 60   Ht '5\' 3"'$  (1.6 m)   Wt 218 lb 6.4 oz (99.1 kg)   SpO2 98%   BMI 38.69 kg/m     Wt Readings from Last 3 Encounters:  05/05/21 218 lb 6.4 oz (99.1 kg)  03/02/21 216 lb 9.6 oz (98.2 kg)  02/12/21 217 lb (98.4 kg)     Constitutional:      Appearance: Healthy appearance. Not in distress.  Neck:     Vascular: JVD normal.  Pulmonary:     Effort: Pulmonary effort is normal.     Breath sounds: No wheezing. No rales.  Cardiovascular:     Normal rate. Regular rhythm. Normal S1. Normal S2.      Murmurs: There is no murmur.  Edema:    Peripheral edema absent.  Abdominal:     Palpations: Abdomen is soft.  Skin:    General: Skin is warm and dry.  Neurological:     Mental Status: Alert and oriented to person, place and time.     Cranial Nerves: Cranial nerves are intact.     ASSESSMENT & PLAN:    1. HFimpEF (heart failure w improved EF) EF was previously 30-35.  This improved to normal by most recent echocardiogram.  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  Volume status is currently stable.  She is NYHA II-IIb.  Continue metoprolol, spironolactone, valsartan.  Follow-up in 6 months.  2. Persistent atrial fibrillation (HCC) Maintaining sinus rhythm by exam.  Monitor in 5/22  demonstrated no evidence of atrial fibrillation.  She is tolerating amiodarone and Apixaban.  Continue current dose of amiodarone and Apixaban.  ALT was normal in 4/22.  She will have follow-up soon with endocrinology who will check her TSH.  Creatinine was normal in June and hemoglobin was normal in April.  Follow-up in 6 months.  3. Essential hypertension The patient's blood pressure is controlled on her current regimen.  Continue current dose of metoprolol tartrate, spironolactone, valsartan.   Dispo:  Return in about 6 months (around 11/05/2021) for Routine Follow Up, w/ Dr. Burt Knack, or Richardson Dopp, PA-C.   Medication Adjustments/Labs and Tests Ordered: Current medicines  are reviewed at length with the patient today.  Concerns regarding medicines are outlined above.  Tests Ordered: No orders of the defined types were placed in this encounter.  Medication Changes: No orders of the defined types were placed in this encounter.   Signed, Richardson Dopp, PA-C  05/05/2021 2:27 PM    [facility] [address], [place], [place]  [address] Phone: [phone]; Fax: [phone]

## 2021-05-11 ENCOUNTER — Other Ambulatory Visit: Payer: Self-pay | Admitting: Physician Assistant

## 2021-05-11 DIAGNOSIS — I10 Essential (primary) hypertension: Secondary | ICD-10-CM

## 2021-05-11 DIAGNOSIS — I5020 Unspecified systolic (congestive) heart failure: Secondary | ICD-10-CM

## 2021-07-17 IMAGING — MG DIGITAL SCREENING BILAT W/ TOMO W/ CAD
8 series · 8 of 24 positions shown · non-contrast
Comparison: Previous exam(s).

CLINICAL DATA: Screening.

EXAM:
DIGITAL SCREENING BILATERAL MAMMOGRAM WITH TOMO AND CAD

[R CC synth-2D]
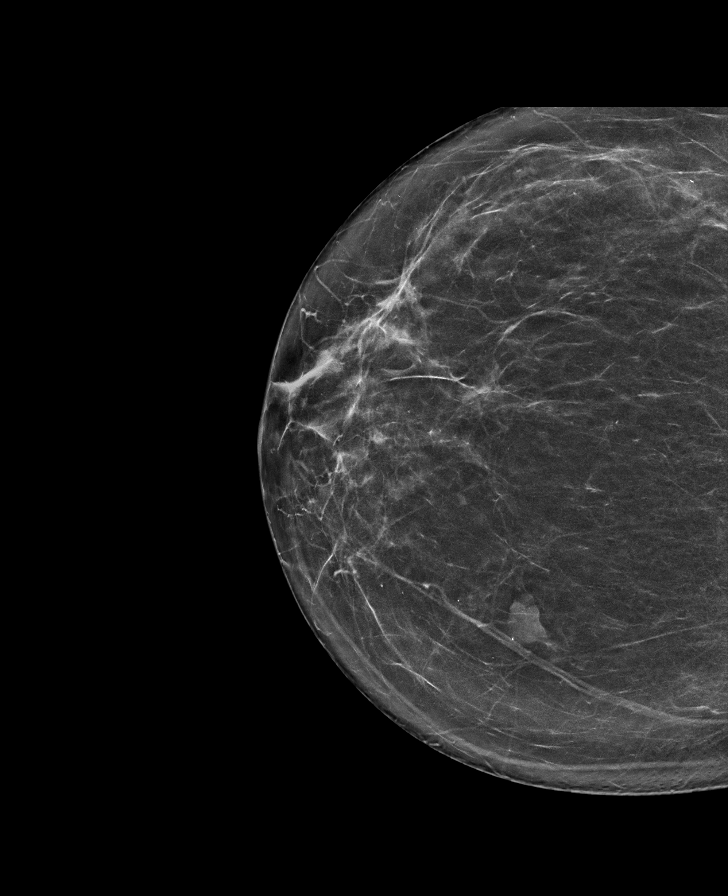

[L MLO synth-2D]
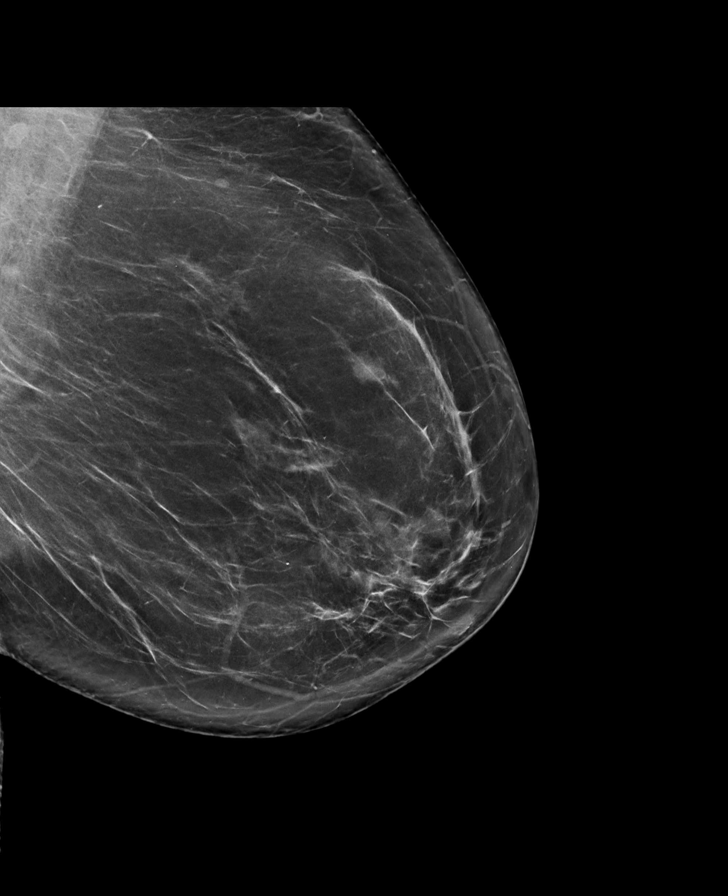

[L CC synth-2D]
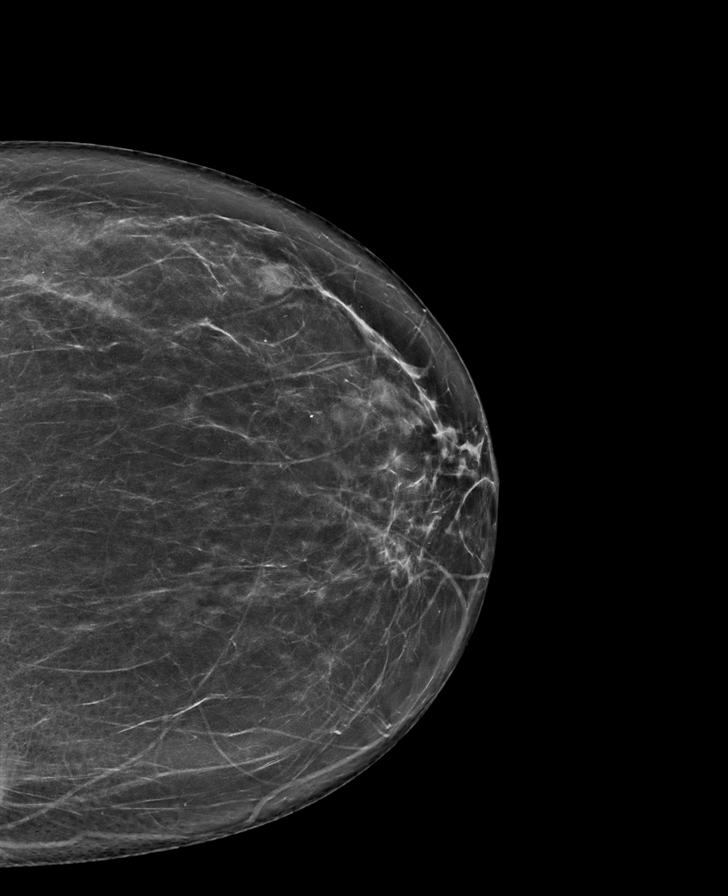

[R MLO synth-2D]
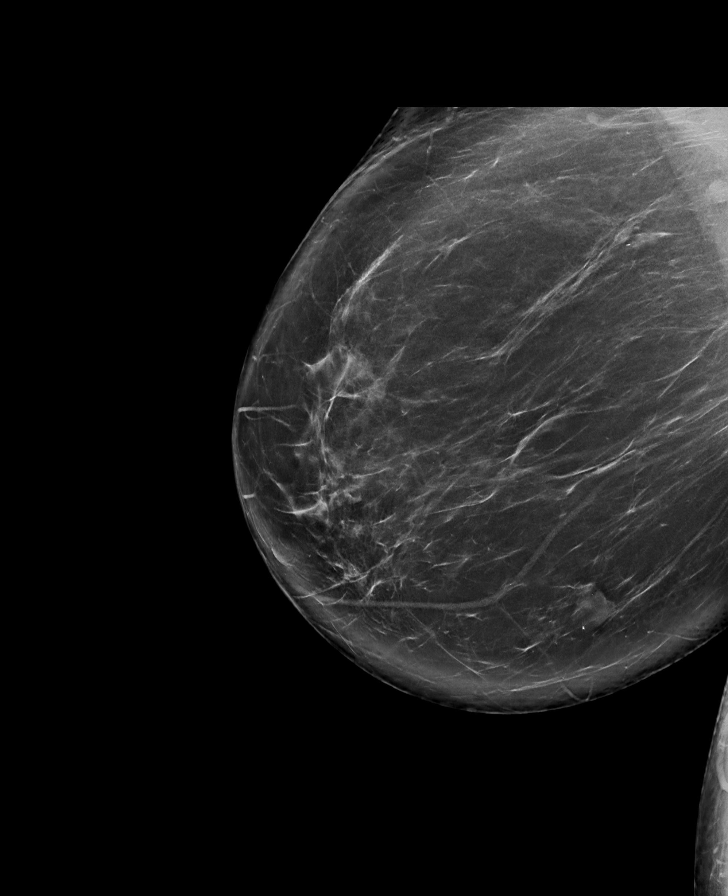

[L CC tomo · tomo slice 39/76.0]
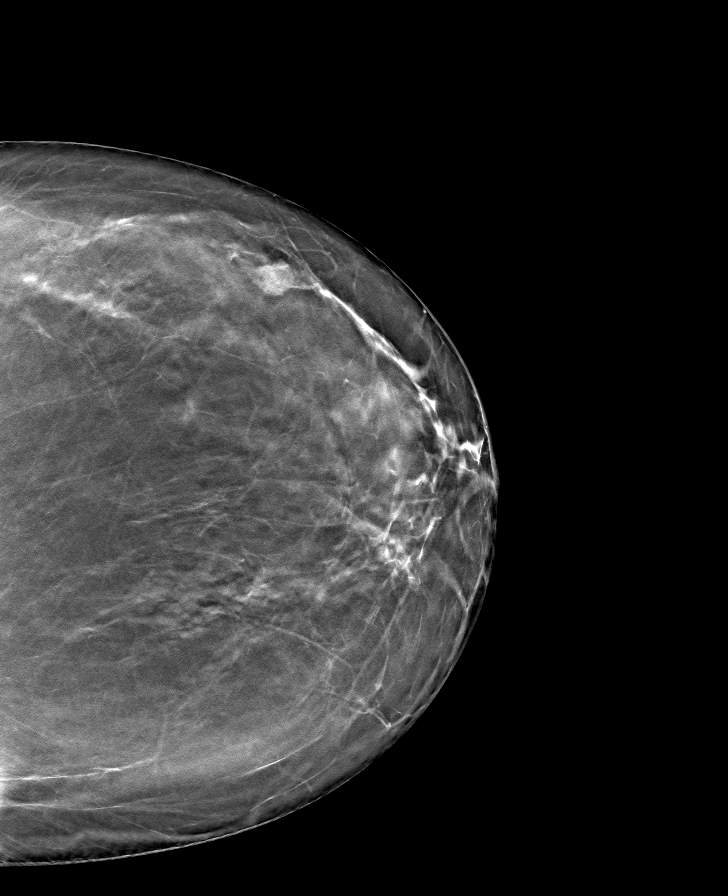

[R CC tomo · tomo slice 37/73.0]
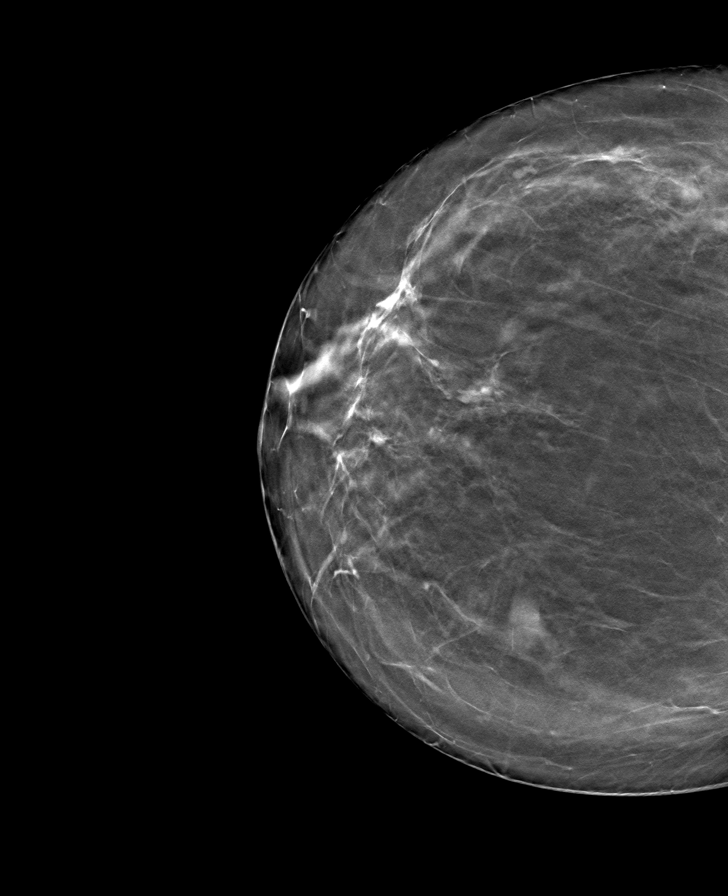

[L MLO tomo · tomo slice 47/92.0]
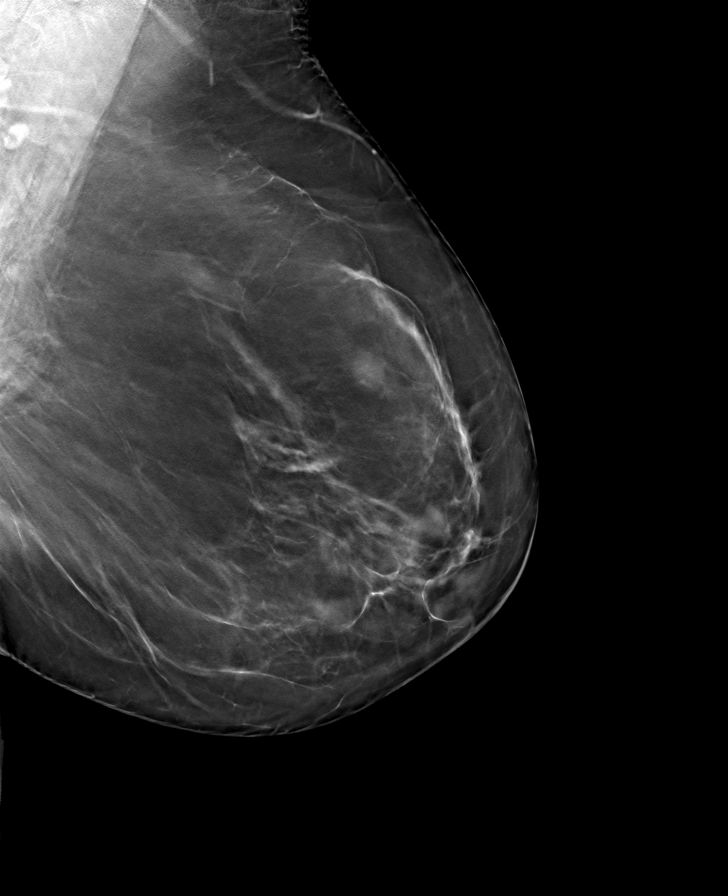

[R MLO tomo · tomo slice 48/95.0]
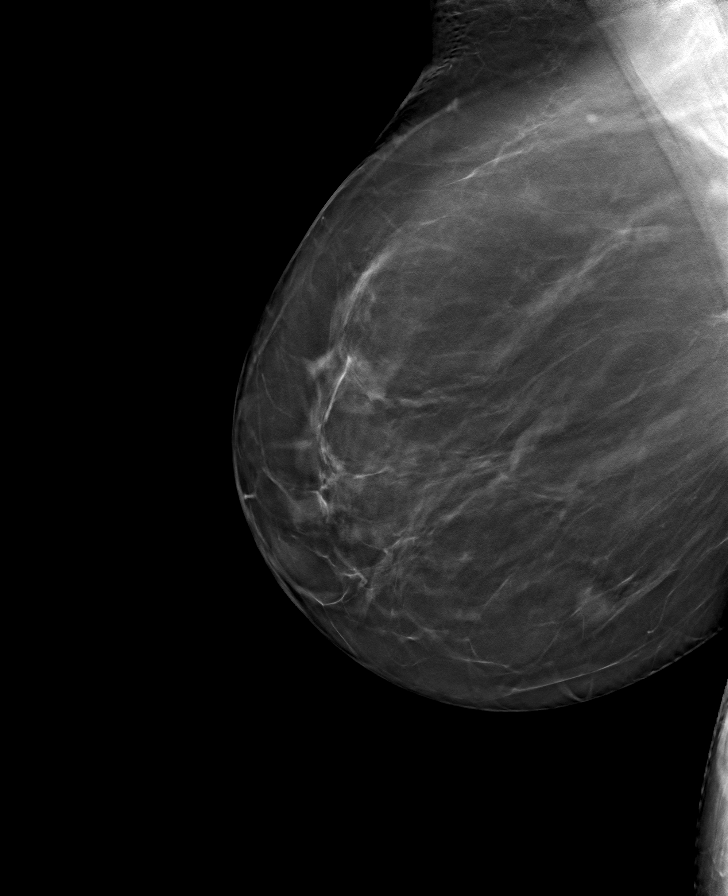

[8 of 24 positions shown; findings below may reference images not displayed]

ACR Breast Density Category b: There are scattered areas of
fibroglandular density.
FINDINGS: There are no findings suspicious for malignancy. Images were
processed with CAD.
IMPRESSION: No mammographic evidence of malignancy. A result letter of this
screening mammogram will be mailed directly to the patient.

RECOMMENDATION:
Screening mammogram in one year. (Code:[id number])

BI-RADS CATEGORY  1: Negative.

## 2021-07-20 ENCOUNTER — Other Ambulatory Visit: Payer: Self-pay | Admitting: Cardiovascular Disease

## 2021-07-20 NOTE — Telephone Encounter (Signed)
Prescription refill request for Eliquis received. Indication: Afib  Last office visit: 05/05/21 Kathlen Mody)  Scr: 0.90 (03/10/21) Age: 75 Weight: 99.1kg  Appropriate dose and refill sent to requested pharmacy.

## 2021-08-09 ENCOUNTER — Other Ambulatory Visit: Payer: Self-pay | Admitting: Physician Assistant

## 2021-08-10 DIAGNOSIS — H18413 Arcus senilis, bilateral: Secondary | ICD-10-CM | POA: Insufficient documentation

## 2021-09-10 ENCOUNTER — Other Ambulatory Visit: Payer: Self-pay | Admitting: Cardiovascular Disease

## 2021-09-22 ENCOUNTER — Other Ambulatory Visit: Payer: Self-pay | Admitting: Family

## 2021-09-22 DIAGNOSIS — I5020 Unspecified systolic (congestive) heart failure: Secondary | ICD-10-CM

## 2021-09-22 DIAGNOSIS — I10 Essential (primary) hypertension: Secondary | ICD-10-CM

## 2021-11-23 ENCOUNTER — Other Ambulatory Visit: Payer: Self-pay

## 2021-11-23 ENCOUNTER — Encounter: Payer: Self-pay | Admitting: Cardiovascular Disease

## 2021-11-23 ENCOUNTER — Ambulatory Visit: Payer: Medicare Other | Admitting: Cardiovascular Disease

## 2021-11-23 VITALS — BP 138/80 | HR 60 | Ht 63.0 in | Wt 225.6 lb

## 2021-11-23 DIAGNOSIS — I5032 Chronic diastolic (congestive) heart failure: Secondary | ICD-10-CM

## 2021-11-23 DIAGNOSIS — Z79899 Other long term (current) drug therapy: Secondary | ICD-10-CM

## 2021-11-23 DIAGNOSIS — I4819 Other persistent atrial fibrillation: Secondary | ICD-10-CM

## 2021-11-23 DIAGNOSIS — I10 Essential (primary) hypertension: Secondary | ICD-10-CM | POA: Diagnosis not present

## 2021-11-23 MED ORDER — AMIODARONE HCL 100 MG PO TABS: 100.0000 mg | ORAL_TABLET | Freq: Every day | ORAL | 3 refills | Status: DC

## 2021-11-23 NOTE — Patient Instructions (Signed)
Medication Instructions:  DEREASE Amiodarone to 100mg  daily *If you need a refill on your cardiac medications before your next appointment, please call your pharmacy*   Lab Work: CMET, CBC If you have labs (blood work) drawn today and your tests are completely normal, you will receive your results only by: [place] (if you have MyChart) OR A paper copy in the mail If you have any lab test that is abnormal or we need to change your treatment, we will call you to review the results.   Testing/Procedures: NONE   Follow-Up: At [facility], you and your health needs are our priority.  As part of our continuing mission to provide you with exceptional heart care, we have created designated Provider Care Teams.  These Care Teams include your primary Cardiologist (physician) and Advanced Practice Providers (APPs -  Physician Assistants and Nurse Practitioners) who all work together to provide you with the care you need, when you need it.  Your next appointment:   6 month(s)  The format for your next appointment:   In Person  Provider:   Richardson Dopp, PA-C     Then, Sherren Mocha, MD will plan to see you again in 1 year(s).

## 2021-11-23 NOTE — Progress Notes (Signed)
Cardiology Office Note:    Date:  11/23/2021   ID:  Michelle Watkins, DOB Feb 02, 1946, MRN [id number]  PCP:  Orpah Melter, MD   [facility] Providers Cardiologist:  Sherren Mocha, MD Cardiology APP:  Sharmon Revere     Referring MD: Orpah Melter, MD   Chief Complaint  Patient presents with   Atrial Fibrillation    History of Present Illness:    Michelle Watkins is a 76 y.o. female with a hx of: Persistent Atrial fibrillation S/p DCCV 07/2020 with ERAF Admx in [place], [place] w AF w RVR in 08/2020>>Amio/DCCV HFimpEF (heart failure with improved ejection fraction)  Tachycardia mediated cardiomyopathy Echo 11/21: EF 30-35 Echo 5/22: EF 69 Hypertension Hypothyroidism   The patient is here alone today.  She is doing well from a cardiac perspective.  She is most limited by osteoarthritis.  She denies chest pain, chest pressure, heart palpitations, leg swelling, orthopnea, PND, lightheadedness, or syncope.  Past Medical History:  Diagnosis Date   HFrEF (heart failure with reduced ejection fraction) (HCC)    EF 30-35, global HK, low normal RVSF, RVSP 39.6, mild RAE, mod MR // Echo 5/22: EF 69, no RWMA, normal RVSF, mild LAE, trivial MR   Hypertension    Persistent atrial fibrillation ([place])    Amio Rx//Apixaban Rx // ZIO monitor 6/22: NSR, average heart rate 57; no pauses or atrial fibrillation; short SVT runs without sustained arrhythmia; PACs/PVCs (<1%)    Thyroid disease     Past Surgical History:  Procedure Laterality Date   APPENDECTOMY     CARDIOVERSION N/A 08/15/2020   Procedure: CARDIOVERSION;  Surgeon: Sanda Klein, MD;  Location: [facility];  Service: Cardiovascular;  Laterality: N/A;   HYSTERECTOMY ABDOMINAL WITH SALPINGECTOMY      Current Medications: Current Meds  Medication Sig   acetaminophen (TYLENOL) 650 MG CR tablet Take 650 mg by mouth every 8 (eight) hours as needed (for pain).   amiodarone (PACERONE) 100 MG tablet Take 1 tablet  (100 mg total) by mouth daily.   ELIQUIS 5 MG TABS tablet TAKE 1 TABLET BY MOUTH TWICE A DAY   hydrALAZINE (APRESOLINE) 25 MG tablet TAKE 1 TABLET BY MOUTH THREE TIMES A DAY FOR BLOOD PRESSURE GREATER THAN 150/90 (Patient taking differently: as needed. Take 1 tablet by mouth three times a day for blood pressure greater than 150/90)   loratadine (CLARITIN) 10 MG tablet Take 10 mg by mouth daily as needed for allergies.   metoprolol tartrate (LOPRESSOR) 25 MG tablet TAKE 1 TABLET BY MOUTH TWICE A DAY   spironolactone (ALDACTONE) 25 MG tablet TAKE 1 TABLET (25 MG TOTAL) BY MOUTH DAILY.   SYNTHROID 125 MCG tablet Take 125 mcg by mouth daily before breakfast.   valsartan (DIOVAN) 80 MG tablet TAKE 1 TABLET BY MOUTH 2 TIMES DAILY.   [DISCONTINUED] amiodarone (PACERONE) 200 MG tablet Take 200mg   - 1 tablet by mouth Monday - Friday and take 100mg  - 1/2 tablet by mouth on Saturday and Sunday     Allergies:   Amlodipine, Penicillins, and Sulfamethoxazole   Social History   Socioeconomic History   Marital status: Married    Spouse name: Not on file   Number of children: Not on file   Years of education: Not on file   Highest education level: Not on file  Occupational History   Not on file  Tobacco Use   Smoking status: Never   Smokeless tobacco: Never  Substance and Sexual Activity   Alcohol  use: Never   Drug use: Never   Sexual activity: Not on file  Other Topics Concern   Not on file  Social History Narrative   Not on file   Social Determinants of Health   Financial Resource Strain: Not on file  Food Insecurity: Not on file  Transportation Needs: Not on file  Physical Activity: Not on file  Stress: Not on file  Social Connections: Not on file     Family History: The patient's family history is not on file.  ROS:   Please see the history of present illness.    All other systems reviewed and are negative.  EKGs/Labs/Other Studies Reviewed:    The following studies were  reviewed today: Event Monitor 01/2021: Summary: The basic rhythm is normal sinus with an average HR of 57 bpm Supraventricular and ventricular ectopics are rare (<1% burden) There are short supraventricular runs present, without sustained SVT  EKG:  EKG is not ordered today.    Recent Labs: 01/20/2021: ALT 30 01/24/2021: Hemoglobin 13.9; Platelets 315 03/10/2021: BUN 18; Creatinine, Ser 0.90; Potassium 4.8; Sodium 139  Recent Lipid Panel No results found for: CHOL, TRIG, HDL, CHOLHDL, VLDL, LDLCALC, LDLDIRECT   Risk Assessment/Calculations:    CHA2DS2-VASc Score = 4   This indicates a 4.8% annual risk of stroke. The patient's score is based upon: CHF History: 1 HTN History: 1 Diabetes History: 0 Stroke History: 0 Vascular Disease History: 0 Age Score: 1 Gender Score: 1          Physical Exam:    VS:  BP 138/80    Pulse 60    Ht 5\' 3"  (1.6 m)    Wt 225 lb 9.6 oz (102.3 kg)    SpO2 96%    BMI 39.96 kg/m     Wt Readings from Last 3 Encounters:  11/23/21 225 lb 9.6 oz (102.3 kg)  05/05/21 218 lb 6.4 oz (99.1 kg)  03/02/21 216 lb 9.6 oz (98.2 kg)     GEN:  Well nourished, well developed in no acute distress HEENT: Normal NECK: No JVD; No carotid bruits LYMPHATICS: No lymphadenopathy CARDIAC: RRR, no murmurs, rubs, gallops RESPIRATORY:  Clear to auscultation without rales, wheezing or rhonchi  ABDOMEN: Soft, non-tender, non-distended MUSCULOSKELETAL:  No edema; No deformity  SKIN: Warm and dry NEUROLOGIC:  Alert and oriented x 3 PSYCHIATRIC:  Normal affect   ASSESSMENT:    1. Persistent atrial fibrillation (HCC)   2. HFimpEF (heart failure w improved EF)   3. Essential hypertension   4. On amiodarone therapy    PLAN:    In order of problems listed above:  Patient is maintaining sinus rhythm on amiodarone.  Her LV function has normalized in sinus rhythm.  I would like to reduce her amiodarone dosing to 100 mg daily.  We discussed the balance between  maintaining that effect dose of amiodarone and keeping her in sinus rhythm but also minimize the potential long-term drug toxicity.  She will have a CBC and metabolic panel checked today.  Her thyroid studies have been followed by her endocrinologist.  She does comply with annual ophthalmology exams and just had her eye exam recently.  She will have 46-month APP follow-up. LVEF has normalized.  The patient is treated with hydralazine, metoprolol, valsartan, and spironolactone.  Most recent echocardiogram is reviewed as above. Blood pressure well controlled on multidrug therapy as outlined.  Check metabolic panel. Labs to be checked.  Lengthy discussion about amiodarone therapy today.  Medication Adjustments/Labs and Tests Ordered: Current medicines are reviewed at length with the patient today.  Concerns regarding medicines are outlined above.  Orders Placed This Encounter  Procedures   CBC   Comprehensive metabolic panel   Meds ordered this encounter  Medications   amiodarone (PACERONE) 100 MG tablet    Sig: Take 1 tablet (100 mg total) by mouth daily.    Dispense:  90 tablet    Refill:  3    Patient Instructions  Medication Instructions:  DEREASE Amiodarone to 100mg  daily *If you need a refill on your cardiac medications before your next appointment, please call your pharmacy*   Lab Work: CMET, CBC If you have labs (blood work) drawn today and your tests are completely normal, you will receive your results only by: [place] (if you have MyChart) OR A paper copy in the mail If you have any lab test that is abnormal or we need to change your treatment, we will call you to review the results.   Testing/Procedures: NONE   Follow-Up: At [facility], you and your health needs are our priority.  As part of our continuing mission to provide you with exceptional heart care, we have created designated Provider Care Teams.  These Care Teams include your primary  Cardiologist (physician) and Advanced Practice Providers (APPs -  Physician Assistants and Nurse Practitioners) who all work together to provide you with the care you need, when you need it.  Your next appointment:   6 month(s)  The format for your next appointment:   In Person  Provider:   Richardson Dopp, PA-C     Then, Sherren Mocha, MD will plan to see you again in 1 year(s).       Signed, Sherren Mocha, MD  11/23/2021 5:32 PM    [place]

## 2021-11-24 LAB — COMPREHENSIVE METABOLIC PANEL
ALT: 38 IU/L — ABNORMAL HIGH (ref 0–32)
AST: 23 IU/L (ref 0–40)
Albumin/Globulin Ratio: 1.6 (ref 1.2–2.2)
Albumin: 4.4 g/dL (ref 3.7–4.7)
Alkaline Phosphatase: 78 IU/L (ref 44–121)
BUN/Creatinine Ratio: 15 (ref 12–28)
BUN: 15 mg/dL (ref 8–27)
Bilirubin Total: 0.2 mg/dL (ref 0.0–1.2)
CO2: 23 mmol/L (ref 20–29)
Calcium: 9.6 mg/dL (ref 8.7–10.3)
Chloride: 105 mmol/L (ref 96–106)
Creatinine, Ser: 0.98 mg/dL (ref 0.57–1.00)
Globulin, Total: 2.7 g/dL (ref 1.5–4.5)
Glucose: 111 mg/dL — ABNORMAL HIGH (ref 70–99)
Potassium: 5.2 mmol/L (ref 3.5–5.2)
Sodium: 141 mmol/L (ref 134–144)
Total Protein: 7.1 g/dL (ref 6.0–8.5)
eGFR: 60 mL/min/{1.73_m2} (ref >59)

## 2021-11-24 LAB — CBC
Hematocrit: 42.3 % (ref 34.0–46.6)
Hemoglobin: 14.2 g/dL (ref 11.1–15.9)
MCH: 30.7 pg (ref 26.6–33.0)
MCHC: 33.6 g/dL (ref 31.5–35.7)
MCV: 91 fL (ref 79–97)
Platelets: 305 10*3/uL (ref 150–450)
RBC: 4.63 x10E6/uL (ref 3.77–5.28)
RDW: 13 % (ref 11.7–15.4)
WBC: 7.7 10*3/uL (ref 3.4–10.8)

## 2022-02-06 ENCOUNTER — Other Ambulatory Visit: Payer: Self-pay | Admitting: Cardiovascular Disease

## 2022-03-23 ENCOUNTER — Other Ambulatory Visit: Payer: Self-pay | Admitting: Cardiovascular Disease

## 2022-03-23 DIAGNOSIS — I10 Essential (primary) hypertension: Secondary | ICD-10-CM

## 2022-03-23 DIAGNOSIS — I5020 Unspecified systolic (congestive) heart failure: Secondary | ICD-10-CM

## 2022-03-28 IMAGING — CR DG CHEST 2V
2 series · 2 of 2 positions shown · non-contrast
Comparison: None.

CLINICAL DATA: Chest pain.

EXAM:
CHEST - 2 VIEW

[chest lat]
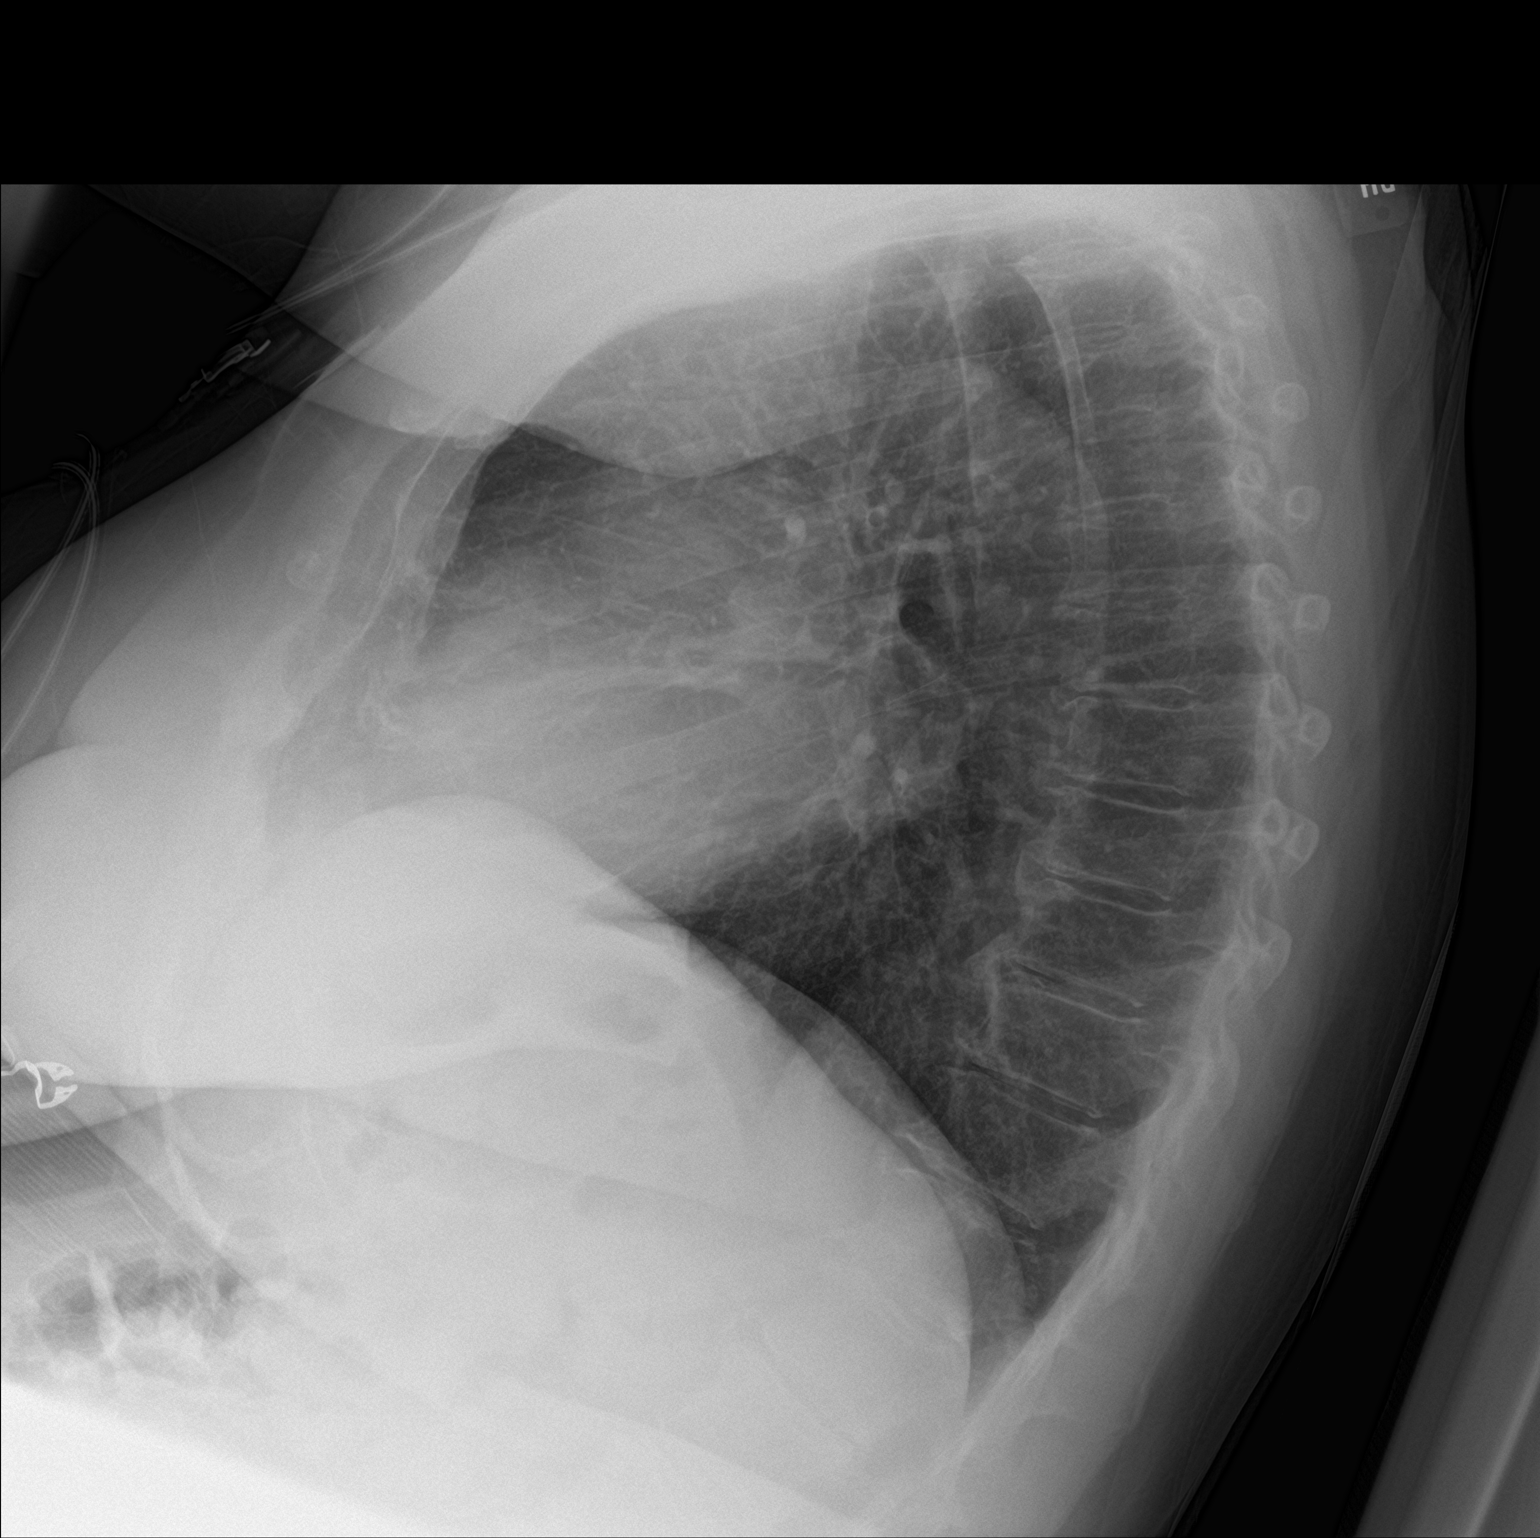

[chest ap]
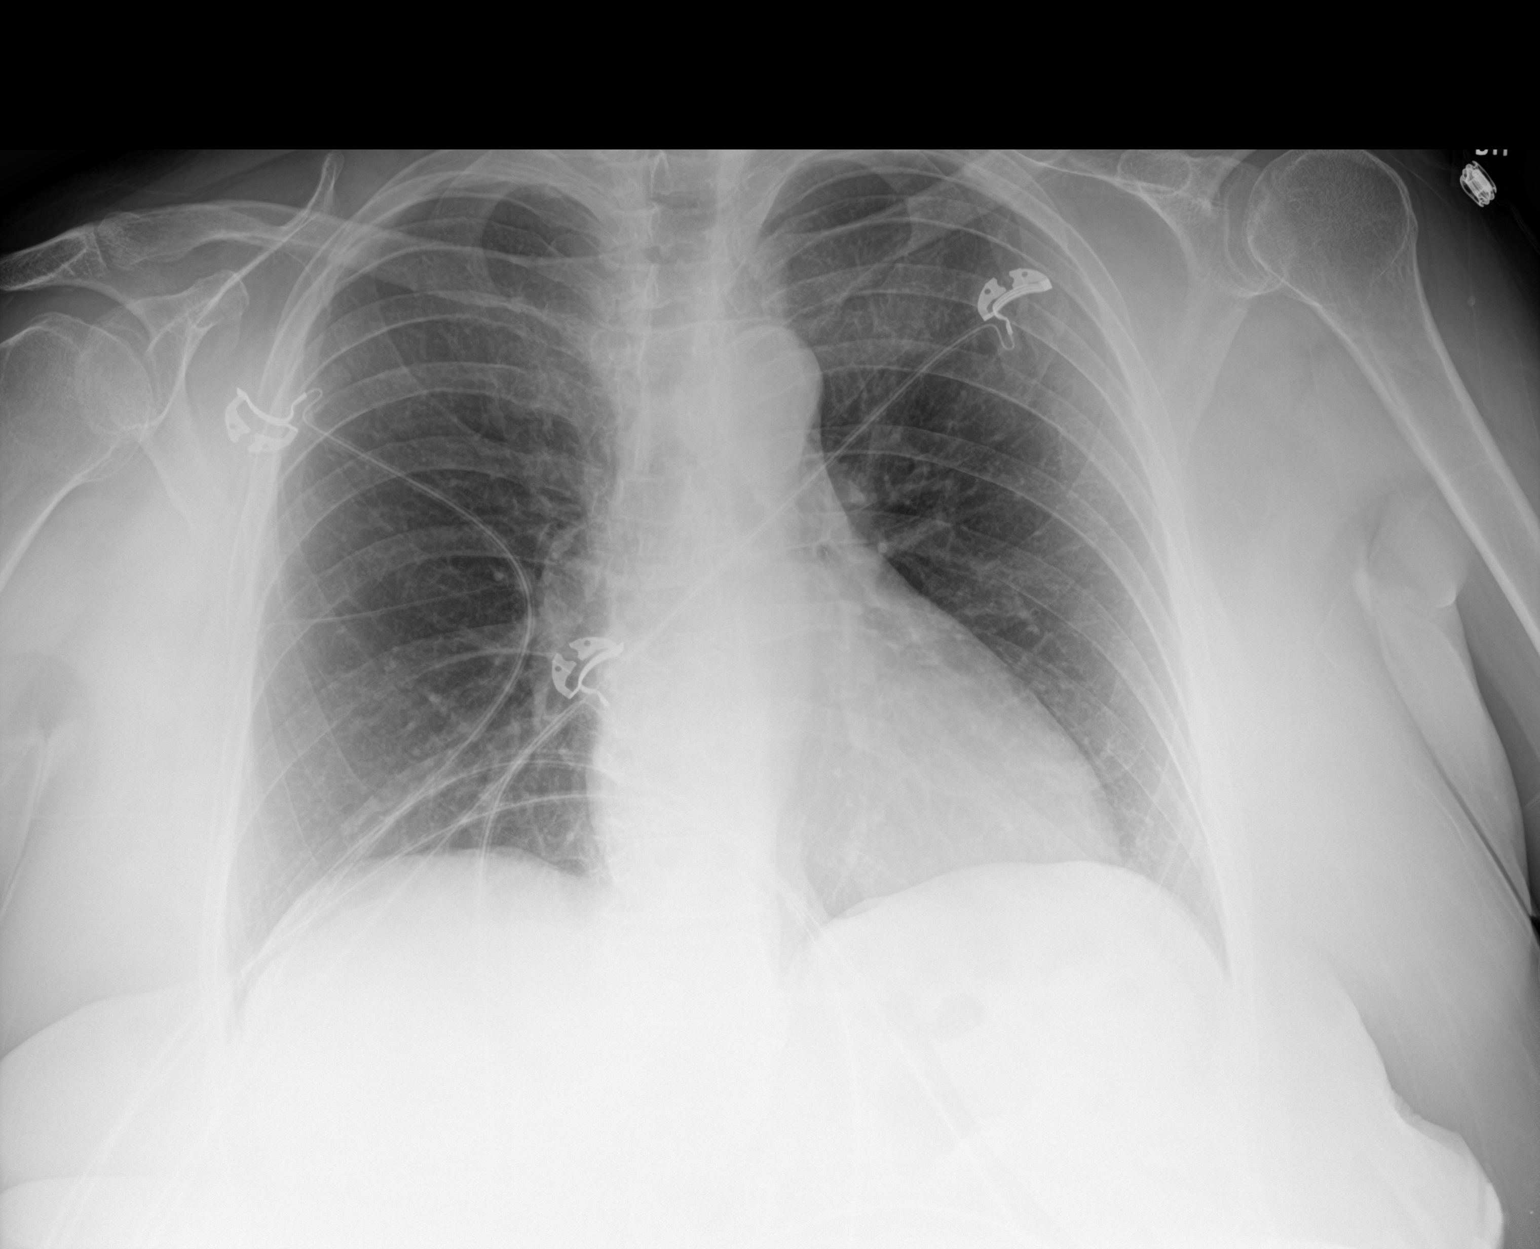

[2 of 2 positions shown; findings below may reference images not displayed]

FINDINGS: Both lungs are clear. Heart and mediastinum are within normal
limits. No pleural effusions. Bridging osteophytes in the thoracic
spine. Negative for a pneumothorax.
IMPRESSION: No active cardiopulmonary disease.

## 2022-04-22 ENCOUNTER — Other Ambulatory Visit: Payer: Self-pay | Admitting: Cardiovascular Disease

## 2022-05-18 ENCOUNTER — Other Ambulatory Visit: Payer: Self-pay | Admitting: Cardiovascular Disease

## 2022-05-18 DIAGNOSIS — D6859 Other primary thrombophilia: Secondary | ICD-10-CM | POA: Insufficient documentation

## 2022-05-18 DIAGNOSIS — I5032 Chronic diastolic (congestive) heart failure: Secondary | ICD-10-CM | POA: Insufficient documentation

## 2022-05-18 DIAGNOSIS — M1990 Unspecified osteoarthritis, unspecified site: Secondary | ICD-10-CM | POA: Insufficient documentation

## 2022-05-20 NOTE — Progress Notes (Signed)
Cardiology Office Note:    Date:  05/21/2022   ID:  Michelle Watkins, DOB 01-08-1946, MRN [id number]  PCP:  Orpah Melter, [place] Providers Cardiologist:  Sherren Mocha, MD Cardiology APP:  Sharmon Revere    Referring MD: Orpah Melter, MD   Chief Complaint:  F/u for AFib, cardiomyopathy    Patient Profile: Persistent Atrial fibrillation S/p DCCV 07/2020 Admx in [place], [place] w AF w RVR in 08/2020>>Amio/DCCV HFimpEF (heart failure with improved ejection fraction)  Tachycardia mediated cardiomyopathy Echo 11/21: EF 30-35 Echo 5/22: EF 69 Hypertension Hypothyroidism   Prior CV Studies: CARDIAC TELEMETRY MONITORING-INTERPRETATION ONLY 02/17/2021  The basic rhythm is normal sinus with an average HR of 57 bpm  Supraventricular and ventricular ectopics are rare (<1% burden)  There are short supraventricular runs present, without sustained SVT    Echocardiogram 02/04/2021 EF 69, no RWMA, normal RVSF, normal PASP, mild LAE, trivial MR.    Echocardiogram 08/13/20 EF 30-35, global HK, low normal RVSF, RVSP 39.6, mild RAE, mod MR    History of Present Illness:   Michelle Watkins is a 76 y.o. female with the above problem list.  She was last seen by Dr. Burt Knack in Feb 23. She returns for f/u.  She is here alone.  Overall, she is doing well without chest pain, shortness of breath, syncope, orthopnea, leg edema.  She does note a long history of insomnia.  She has difficulty falling asleep.  She has not tried any sleep aids.  I have recommended she try over-the-counter melatonin.  I also recommended meditation.        Past Medical History:  Diagnosis Date   HFrEF (heart failure with reduced ejection fraction) (HCC)    EF 30-35, global HK, low normal RVSF, RVSP 39.6, mild RAE, mod MR // Echo 5/22: EF 69, no RWMA, normal RVSF, mild LAE, trivial MR   Hypertension    Persistent atrial fibrillation ([place])    Amio Rx//Apixaban Rx // ZIO monitor 6/22: NSR,  average heart rate 57; no pauses or atrial fibrillation; short SVT runs without sustained arrhythmia; PACs/PVCs (<1%)    Thyroid disease    Current Medications: Current Meds  Medication Sig   acetaminophen (TYLENOL) 650 MG CR tablet Take 650 mg by mouth every 8 (eight) hours as needed (for pain).   amiodarone (PACERONE) 100 MG tablet Take 1 tablet (100 mg total) by mouth daily.   ELIQUIS 5 MG TABS tablet TAKE 1 TABLET BY MOUTH TWICE A DAY   hydrALAZINE (APRESOLINE) 25 MG tablet TAKE 1 TABLET BY MOUTH THREE TIMES A DAY FOR BLOOD PRESSURE GREATER THAN 150/90   loratadine (CLARITIN) 10 MG tablet Take 10 mg by mouth daily as needed for allergies.   metoprolol tartrate (LOPRESSOR) 25 MG tablet TAKE 1 TABLET BY MOUTH TWICE A DAY   spironolactone (ALDACTONE) 25 MG tablet TAKE 1 TABLET (25 MG TOTAL) BY MOUTH DAILY.   SYNTHROID 125 MCG tablet Take 125 mcg by mouth daily before breakfast.   valsartan (DIOVAN) 80 MG tablet TAKE 1 TABLET BY MOUTH TWICE A DAY    Allergies:   Amlodipine, Penicillins, and Sulfamethoxazole   Social History   Tobacco Use   Smoking status: Never   Smokeless tobacco: Never  Substance Use Topics   Alcohol use: Never   Drug use: Never    Family Hx: The patient's family history is not on file.  Review of Systems  Gastrointestinal:  Negative for hematochezia and melena.  Genitourinary:  Negative for hematuria.     EKGs/Labs/Other Test Reviewed:    EKG:  EKG is  ordered today.  The ekg ordered today demonstrates sinus bradycardia, HR 51, leftward axis, low voltage, no ST-T wave changes, QTc 444  Recent Labs: 11/23/2021: ALT 38; BUN 15; Creatinine, Ser 0.98; Hemoglobin 14.2; Platelets 305; Potassium 5.2; Sodium 141   Recent Lipid Panel No results for input(s): "CHOL", "TRIG", "HDL", "VLDL", "LDLCALC", "LDLDIRECT" in the last 8760 hours.   Risk Assessment/Calculations/Metrics:    CHA2DS2-VASc Score = 5   This indicates a 7.2% annual risk of stroke. The  patient's score is based upon: CHF History: 1 HTN History: 1 Diabetes History: 0 Stroke History: 0 Vascular Disease History: 0 Age Score: 2 Gender Score: 1        HYPERTENSION CONTROL Vitals:   05/21/22 1122 05/21/22 1212  BP: (!) 176/72 (!) 146/90    The patient's blood pressure is elevated above target today.  In order to address the patient's elevated BP: Blood pressure will be monitored at home to determine if medication changes need to be made.       Physical Exam:    VS:  BP (!) 146/90   Pulse (!) 51   Ht '5\' 3"'$  (1.6 m)   Wt 220 lb 12.8 oz (100.2 kg)   SpO2 98%   BMI 39.11 kg/m     Wt Readings from Last 3 Encounters:  05/21/22 220 lb 12.8 oz (100.2 kg)  11/23/21 225 lb 9.6 oz (102.3 kg)  05/05/21 218 lb 6.4 oz (99.1 kg)    Constitutional:      Appearance: Healthy appearance. Not in distress.  Neck:     Vascular: JVD normal.  Pulmonary:     Effort: Pulmonary effort is normal.     Breath sounds: No wheezing. No rales.  Cardiovascular:     Normal rate. Regular rhythm. Normal S1. Normal S2.      Murmurs: There is no murmur.  Edema:    Peripheral edema absent.  Abdominal:     Palpations: Abdomen is soft.  Skin:    General: Skin is warm and dry.  Neurological:     Mental Status: Alert and oriented to person, place and time.         ASSESSMENT & PLAN:   Essential hypertension BP above target. She checks it infrequently at home. She has noted systolic BP readings of 616W and 150s in the past. She did just return from vacation and notes her diet was rich in salt while she was away. She also has an issue with insomnia. She does not have a hx of significant snoring or witnessed apnea.  Continue metoprolol tartrate 25 mg twice daily, spironolactone 25 mg daily, valsartan 80 mg twice daily.  Monitor blood pressure for 2 weeks and send readings for review.  If blood pressure remains above target, change hydralazine from as needed to 25 mg 3 times a day.  HFrEF  (heart failure with reduced ejection fraction) (HCC) Tachycardia mediated cardiomyopathy.  EF was previously 30-35 and improved to normal with normal sinus rhythm.  NYHA II.  Volume status stable.  Continue metoprolol tartrate 25 mg twice daily, valsartan 80 mg twice daily.  Atrial fibrillation (HCC) Maintaining sinus rhythm on amiodarone therapy.  Recent ALT normal.  TSH on 01/21/2022 (Care Everywhere) was normal at 3.12.  Continue amiodarone 100 mg daily, Eliquis 5 mg twice daily.            Dispo:  Return in  about 6 months (around 11/21/2022) for Routine Follow Up, w/ Dr. Burt Knack.   Medication Adjustments/Labs and Tests Ordered: Current medicines are reviewed at length with the patient today.  Concerns regarding medicines are outlined above.  Tests Ordered: Orders Placed This Encounter  Procedures   EKG 12-Lead   Medication Changes: No orders of the defined types were placed in this encounter.  Signed, Richardson Dopp, PA-C  05/21/2022 12:18 PM    [facility] [address], [place], [place]  [address] Phone: [phone]; Fax: [phone]

## 2022-05-21 ENCOUNTER — Encounter: Payer: Self-pay | Admitting: Physician Assistant

## 2022-05-21 ENCOUNTER — Ambulatory Visit: Payer: Medicare Other | Admitting: Physician Assistant

## 2022-05-21 VITALS — BP 146/90 | HR 51 | Ht 63.0 in | Wt 220.8 lb

## 2022-05-21 DIAGNOSIS — I10 Essential (primary) hypertension: Secondary | ICD-10-CM | POA: Diagnosis not present

## 2022-05-21 DIAGNOSIS — I4819 Other persistent atrial fibrillation: Secondary | ICD-10-CM | POA: Diagnosis not present

## 2022-05-21 DIAGNOSIS — I5020 Unspecified systolic (congestive) heart failure: Secondary | ICD-10-CM

## 2022-05-21 NOTE — Assessment & Plan Note (Signed)
BP above target. She checks it infrequently at home. She has noted systolic BP readings of 712W and 150s in the past. She did just return from vacation and notes her diet was rich in salt while she was away. She also has an issue with insomnia. She does not have a hx of significant snoring or witnessed apnea.  Continue metoprolol tartrate 25 mg twice daily, spironolactone 25 mg daily, valsartan 80 mg twice daily.  Monitor blood pressure for 2 weeks and send readings for review.  If blood pressure remains above target, change hydralazine from as needed to 25 mg 3 times a day.

## 2022-05-21 NOTE — Patient Instructions (Signed)
Medication Instructions:  Your physician recommends that you continue on your current medications as directed. Please refer to the Current Medication list given to you today.  You can try over the counter Melatonin for sleep  *If you need a refill on your cardiac medications before your next appointment, please call your pharmacy*   Lab Work: None ordered  If you have labs (blood work) drawn today and your tests are completely normal, you will receive your results only by: [place] (if you have MyChart) OR A paper copy in the mail If you have any lab test that is abnormal or we need to change your treatment, we will call you to review the results.   Testing/Procedures: None ordered   Follow-Up: At [facility], you and your health needs are our priority.  As part of our continuing mission to provide you with exceptional heart care, we have created designated Provider Care Teams.  These Care Teams include your primary Cardiologist (physician) and Advanced Practice Providers (APPs -  Physician Assistants and Nurse Practitioners) who all work together to provide you with the care you need, when you need it.  We recommend signing up for the patient portal called "MyChart".  Sign up information is provided on this After Visit Summary.  MyChart is used to connect with patients for Virtual Visits (Telemedicine).  Patients are able to view lab/test results, encounter notes, upcoming appointments, etc.  Non-urgent messages can be sent to your provider as well.   To learn more about what you can do with MyChart, go to [email or web address].    Your next appointment:   6 month(s)  The format for your next appointment:   In Person  Provider:   Sherren Mocha, MD     Other Instructions Your physician has requested that you regularly monitor and record your blood pressure readings at home. Please use the same machine at the same time of day to check your readings and record them to  bring to your follow-up visit.   Please monitor blood pressures and keep a log of your readings and in 2 weeks call me with those.     Make sure to check 2 hours after your medications.    AVOID these things for 30 minutes before checking your blood pressure: No Drinking caffeine. No Drinking alcohol. No Eating. No Smoking. No Exercising.   Five minutes before checking your blood pressure: Pee. Sit in a dining chair. Avoid sitting in a soft couch or armchair. Be quiet. Do not talk   Important Information About Sugar

## 2022-05-21 NOTE — Assessment & Plan Note (Signed)
Tachycardia mediated cardiomyopathy.  EF was previously 30-35 and improved to normal with normal sinus rhythm.  NYHA II.  Volume status stable.  Continue metoprolol tartrate 25 mg twice daily, valsartan 80 mg twice daily.

## 2022-05-21 NOTE — Assessment & Plan Note (Signed)
Maintaining sinus rhythm on amiodarone therapy.  Recent ALT normal.  TSH on 01/21/2022 (Care Everywhere) was normal at 3.12.  Continue amiodarone 100 mg daily, Eliquis 5 mg twice daily.

## 2022-05-24 ENCOUNTER — Telehealth: Payer: Self-pay | Admitting: Physician Assistant

## 2022-05-24 NOTE — Telephone Encounter (Signed)
Returned call to pt.  She wanted to clarify dose of Amiodarone.  She had the 200 mg tablets which she has been cutting in 1/2.  She thought the med list was wrong, but it is correct.

## 2022-05-24 NOTE — Telephone Encounter (Signed)
New Message:     Pt c/o medication issue:  1. Name of Medication all of her medicine: her medication list  2. How are you currently taking this medication (dosage and times per day)?   3. Are you having a reaction (difficulty breathing--STAT)?   4. What is your medication issue? Need to go over medication list- she says something is not right

## 2022-05-25 NOTE — Telephone Encounter (Signed)
Per OV note by Kathlen Mody , PA on 05/21/22:  Atrial fibrillation (HCC) Maintaining sinus rhythm on amiodarone therapy.  Recent ALT normal.  TSH on 01/21/2022 (Care Everywhere) was normal at 3.12.  Continue amiodarone 100 mg daily, Eliquis 5 mg twice daily.    Patient has been made aware of '100mg'$  being the appropriate dose already.

## 2022-06-05 ENCOUNTER — Other Ambulatory Visit: Payer: Self-pay | Admitting: Cardiovascular Disease

## 2022-06-07 NOTE — Telephone Encounter (Signed)
Prescription refill request for Eliquis received. Indication:Afib Last office visit:8/23 Scr:0.9 Age: 76 Weight:100.2 kg  Prescription refilled

## 2022-06-21 ENCOUNTER — Telehealth: Payer: Self-pay | Admitting: Physician Assistant

## 2022-06-21 MED ORDER — HYDRALAZINE HCL 25 MG PO TABS: 25.0000 mg | ORAL_TABLET | Freq: Three times a day (TID) | ORAL | 1 refills | Status: DC

## 2022-06-21 NOTE — Telephone Encounter (Signed)
Call placed to pt.  She has been made aware of her blood pressure reading results. She has been made aware to increase Hydralazine 25 to three times a day.  Pt was scheduled to see Richardson Dopp, PA-C, back in 6 weeks, 07/02/22 3:10.

## 2022-06-21 NOTE — Telephone Encounter (Signed)
Reviewed BP readings sent by patient. Majority of BPs are above goal. We currently have on her med list that she takes Hydralazine as needed for high BP. PLAN: Change Hydralazine to 25 mg three times a day  F/u in PharmD HTN clinic in 6 weeks. Richardson Dopp, PA-C    06/21/2022 2:21 PM

## 2022-08-02 ENCOUNTER — Encounter: Payer: Self-pay | Admitting: Physician Assistant

## 2022-08-02 ENCOUNTER — Ambulatory Visit: Payer: Medicare Other | Attending: Physician Assistant | Admitting: Physician Assistant

## 2022-08-02 VITALS — BP 188/80 | HR 58 | Ht 60.0 in | Wt 220.4 lb

## 2022-08-02 DIAGNOSIS — I4819 Other persistent atrial fibrillation: Secondary | ICD-10-CM | POA: Diagnosis not present

## 2022-08-02 DIAGNOSIS — I5020 Unspecified systolic (congestive) heart failure: Secondary | ICD-10-CM | POA: Diagnosis not present

## 2022-08-02 DIAGNOSIS — I10 Essential (primary) hypertension: Secondary | ICD-10-CM | POA: Diagnosis not present

## 2022-08-02 NOTE — Assessment & Plan Note (Signed)
Maintaining sinus rhythm on exam. TSH from Care Everywhere 07/28/2022: 1.94.  Hemoglobin, Creatinine, ALT from [facility] 11/23/21: Hgb 14.2, SCr 0.98, ALT 38. Continue amiodarone 100 mg daily, metoprolol tartrate 12.5 mg twice daily and Eliquis 5 mg twice daily.

## 2022-08-02 NOTE — Assessment & Plan Note (Signed)
Blood pressure uncontrolled.  She thinks that she may have missed her medications this morning.  Her blood pressure is at home and at other clinic visits has been in the 983J systolic.  She will check her medications when she gets home.  I have advised her that, if she did take her medications this morning, she should increase hydralazine to 50 mg 3 times a day.  Otherwise, she can continue hydralazine 25 mg 3 times a day along with metoprolol 12.5 mg twice daily, spironolactone 25 mg daily and valsartan 80 mg twice daily.  If her blood pressure continues to be difficult to control, we could increase valsartan to 160 mg twice daily.  I have asked her to send some blood pressure readings for review in a couple weeks.  Follow-up in 6 months.

## 2022-08-02 NOTE — Progress Notes (Signed)
Cardiology Office Note:    Date:  08/02/2022   ID:  Michelle Watkins, DOB [birth year], MRN [id number]  PCP:  Orpah Melter, [place] Providers Cardiologist:  Sherren Mocha, MD Cardiology APP:  Sh[facility] [place]    Referring MD: Orpah Melter, MD   Chief Complaint:  F/u for HTN    Patient Profile: Persistent Atrial fibrillation S/p DCCV 07/2020 Admx in [place], [place] w AF w RVR in 08/2020>>Amio/DCCV Monitor 01/2021: NSR, average heart rate 57, <1% PACs/PVCs; short supraventricular runs HFimpEF (heart failure with improved ejection fraction)  Tachycardia mediated cardiomyopathy Echo 07/2020: EF 30-35, global HK, low normal RVSF, RVSP 39.6, mild RAE, mod MR  Echo 02/04/2021: EF 69, no RWMA, normal RVSF, normal PASP, mild LAE, trivial MR.   Hypertension Hypothyroidism   Cardiac Studies & Procedures       ECHOCARDIOGRAM  ECHOCARDIOGRAM LIMITED 02/04/2021  Narrative ECHOCARDIOGRAM LIMITED REPORT    Patient Name:   Michelle Watkins Date of Exam: 02/04/2021 Medical Rec #:  [id number]      Height:       63.0 in Accession #:    [id number]     Weight:       216.2 lb Date of Birth:  1946-05-06      BSA:          1.999 m Patient Age:    76 years       BP:           132/72 mmHg Patient Gender: F              HR:           54 bpm. Exam Location:  [address]  Procedure: Limited Echo, 3D Echo, Cardiac Doppler and Limited Color Doppler  Indications:    I48.19 Atrial Fibrillation  History:        Patient has prior history of Echocardiogram examinations, most recent 08/13/2020. Arrythmias:Atrial Fibrillation; Risk Factors:Hypertension. Edema, HFrEF.  Sonographer:    Deliah Boston RDCS Referring Phys: [place]   1. Left ventricular ejection fraction by 3D volume is 69 %. The left ventricle has no regional wall motion abnormalities. 2. Right ventricular systolic function is normal. The right ventricular size is normal. There is  normal pulmonary artery systolic pressure. 3. Left atrial size was mildly dilated. 4. The mitral valve is normal in structure. Trivial mitral valve regurgitation. No evidence of mitral stenosis. 5. The aortic valve is normal in structure. Aortic valve regurgitation is not visualized. No aortic stenosis is present. 6. The inferior vena cava is normal in size with greater than 50% respiratory variability, suggesting right atrial pressure of 3 mmHg.  FINDINGS Left Ventricle: Left ventricular ejection fraction by 3D volume is 69 %. The left ventricle has no regional wall motion abnormalities. The left ventricular internal cavity size was normal in size. There is no left ventricular hypertrophy.  Right Ventricle: The right ventricular size is normal. No increase in right ventricular wall thickness. Right ventricular systolic function is normal. There is normal pulmonary artery systolic pressure. The tricuspid regurgitant velocity is 2.64 m/s, and with an assumed right atrial pressure of 3 mmHg, the estimated right ventricular systolic pressure is 62.5 mmHg.  Left Atrium: Left atrial size was mildly dilated.  Right Atrium: Right atrial size was not assessed.  Pericardium: There is no evidence of pericardial effusion.  Mitral Valve: The mitral valve is normal in structure. Mild mitral annular calcification. Trivial mitral valve regurgitation.  No evidence of mitral valve stenosis. MV peak gradient, 9.0 mmHg. The mean mitral valve gradient is 3.0 mmHg.  Tricuspid Valve: The tricuspid valve is normal in structure. Tricuspid valve regurgitation is not demonstrated. No evidence of tricuspid stenosis.  Aortic Valve: The aortic valve is normal in structure. Aortic valve regurgitation is not visualized. No aortic stenosis is present.  Pulmonic Valve: The pulmonic valve was normal in structure. Pulmonic valve regurgitation is not visualized. No evidence of pulmonic stenosis.  Aorta: The aortic root is  normal in size and structure.  Venous: The inferior vena cava is normal in size with greater than 50% respiratory variability, suggesting right atrial pressure of 3 mmHg.  IAS/Shunts: No atrial level shunt detected by color flow Doppler.  LEFT VENTRICLE PLAX 2D LVIDd:         4.15 cm         Diastology LVIDs:         2.50 cm         LV e' medial:    7.62 cm/s LV PW:         0.75 cm         LV E/e' medial:  18.9 LV IVS:        0.75 cm         LV e' lateral:   8.59 cm/s LVOT diam:     2.00 cm         LV E/e' lateral: 16.8 LV SV:         85 LV SV Index:   42 LVOT Area:     3.14 cm        3D Volume EF LV 3D EF:    Left ventricular ejection fraction by 3D volume is 69 %.  3D Volume EF: 3D EF:        69 % LV EDV:       114 ml LV ESV:       35 ml LV SV:        78 ml  RIGHT VENTRICLE RV S prime:     14.80 cm/s TAPSE (M-mode): 2.5 cm  LEFT ATRIUM             Index       RIGHT ATRIUM           Index LA diam:        4.30 cm 2.15 cm/m  RA Area:     14.90 cm LA Vol (A2C):   76.5 ml 38.27 ml/m RA Volume:   36.70 ml  18.36 ml/m LA Vol (A4C):   70.1 ml 35.07 ml/m LA Biplane Vol: 73.1 ml 36.57 ml/m AORTIC VALVE LVOT Vmax:   97.40 cm/s LVOT Vmean:  63.350 cm/s LVOT VTI:    0.269 m  AORTA Ao Root diam: 3.50 cm Ao Asc diam:  3.30 cm  MITRAL VALVE                TRICUSPID VALVE MV Area (PHT): 1.59 cm     TR Peak grad:   27.9 mmHg MV Area VTI:   1.46 cm     TR Vmax:        264.00 cm/s MV Peak grad:  9.0 mmHg MV Mean grad:  3.0 mmHg     SHUNTS MV Vmax:       1.50 m/s     Systemic VTI:  0.27 m MV Vmean:      70.7 cm/s    Systemic Diam: 2.00 cm MV Decel  Time: 346 msec MR Peak grad: 124.5 mmHg MR Mean grad: 80.0 mmHg MR Vmax:      558.00 cm/s MR Vmean:     417.0 cm/s MV E velocity: 144.00 cm/s MV A velocity: 120.50 cm/s MV E/A ratio:  1.20  Candee Furbish MD Electronically signed by Candee Furbish MD Signature Date/Time: 02/04/2021/4:35:51 PM    Final     MONITORS  LONG TERM MONITOR-LIVE TELEMETRY (3-14 DAYS) 03/03/2021  Narrative Patch Wear Time:  13 days and 23 hours (2022-05-05T15:55:02-399 to 2022-05-19T15:51:03-399)  Patient had a min HR of 41 bpm, max HR of 160 bpm, and avg HR of 57 bpm. Predominant underlying rhythm was Sinus Rhythm. 92 Supraventricular Tachycardia runs occurred, the run with the fastest interval lasting 7 beats with a max rate of 160 bpm, the longest lasting 19.0 secs with an avg rate of 100 bpm. Isolated SVEs were rare (<1.0%), SVE Couplets were rare (<1.0%), and SVE Triplets were rare (<1.0%). Isolated VEs were rare (<1.0%, 157), VE Couplets were rare (<1.0%, 9), and VE Triplets were rare (<1.0%, 2).  Summary:  The basic rhythm is normal sinus with an average HR of 57 bpm  Supraventricular and ventricular ectopics are rare (<1% burden)  There are short supraventricular runs present, without sustained SVT            History of Present Illness:   Michelle Watkins is a 76 y.o. female with the above problem list.  She she is here alone.  Since last seen, she is doing well without chest pain, significant shortness of breath, orthopnea, leg edema or syncope.  Blood pressures at home are typically in the 130s/60s.     Past Medical History:  Diagnosis Date   HFrEF (heart failure with reduced ejection fraction) (HCC)    EF 30-35, global HK, low normal RVSF, RVSP 39.6, mild RAE, mod MR // Echo 5/22: EF 69, no RWMA, normal RVSF, mild LAE, trivial MR   Hypertension    Persistent atrial fibrillation ([place])    Amio Rx//Apixaban Rx // ZIO monitor 6/22: NSR, average heart rate 57; no pauses or atrial fibrillation; short SVT runs without sustained arrhythmia; PACs/PVCs (<1%)    Thyroid disease    Current Medications: Current Meds  Medication Sig   acetaminophen (TYLENOL) 650 MG CR tablet Take 650 mg by mouth every 8 (eight) hours as needed (for pain).   amiodarone (PACERONE) 100 MG tablet Take 1 tablet (100 mg total) by  mouth daily.   ELIQUIS 5 MG TABS tablet TAKE 1 TABLET BY MOUTH TWICE A DAY   hydrALAZINE (APRESOLINE) 25 MG tablet Take 1 tablet (25 mg total) by mouth 3 (three) times daily.   loratadine (CLARITIN) 10 MG tablet Take 10 mg by mouth daily as needed for allergies.   metoprolol tartrate (LOPRESSOR) 25 MG tablet Take 12.5 mg by mouth 2 (two) times daily.   spironolactone (ALDACTONE) 25 MG tablet TAKE 1 TABLET (25 MG TOTAL) BY MOUTH DAILY.   SYNTHROID 125 MCG tablet Take 125 mcg by mouth daily before breakfast.   valsartan (DIOVAN) 80 MG tablet TAKE 1 TABLET BY MOUTH TWICE A DAY    Allergies:   Amlodipine, Penicillins, and Sulfamethoxazole   Social History   Tobacco Use   Smoking status: Never   Smokeless tobacco: Never  Substance Use Topics   Alcohol use: Never   Drug use: Never    Family Hx: The patient's family history is not on file.  Review of Systems  Gastrointestinal:  Negative  for hematochezia.  Genitourinary:  Negative for hematuria.     EKGs/Labs/Other Test Reviewed:    EKG:  EKG is not ordered today.    Recent Labs: 11/23/2021: ALT 38; BUN 15; Creatinine, Ser 0.98; Hemoglobin 14.2; Platelets 305; Potassium 5.2; Sodium 141   Recent Lipid Panel No results for input(s): "CHOL", "TRIG", "HDL", "VLDL", "LDLCALC", "LDLDIRECT" in the last 8760 hours.    Risk Assessment/Calculations/Metrics:    CHA2DS2-VASc Score = 5   This indicates a 7.2% annual risk of stroke. The patient's score is based upon: CHF History: 1 HTN History: 1 Diabetes History: 0 Stroke History: 0 Vascular Disease History: 0 Age Score: 2 Gender Score: 1        HYPERTENSION CONTROL Vitals:   08/02/22 1507 08/02/22 1629  BP: (!) 160/60 (!) 188/80    The patient's blood pressure is elevated above target today.  In order to address the patient's elevated BP: Blood pressure will be monitored at home to determine if medication changes need to be made.       Physical Exam:    VS:  BP (!)  188/80   Pulse (!) 58   Ht 5' (1.524 m)   Wt 220 lb 6.4 oz (100 kg)   SpO2 98%   BMI 43.04 kg/m     Wt Readings from Last 3 Encounters:  08/02/22 220 lb 6.4 oz (100 kg)  05/21/22 220 lb 12.8 oz (100.2 kg)  11/23/21 225 lb 9.6 oz (102.3 kg)    Constitutional:      Appearance: Healthy appearance. Not in distress.  Neck:     Vascular: JVD normal.  Pulmonary:     Effort: Pulmonary effort is normal.     Breath sounds: No wheezing. No rales.  Cardiovascular:     Normal rate. Regular rhythm. Normal S1. Normal S2.      Murmurs: There is no murmur.  Edema:    Peripheral edema absent.  Abdominal:     Palpations: Abdomen is soft.  Skin:    General: Skin is warm and dry.  Neurological:     Mental Status: Alert and oriented to person, place and time.          ASSESSMENT & PLAN:   Essential hypertension Blood pressure uncontrolled.  She thinks that she may have missed her medications this morning.  Her blood pressure is at home and at other clinic visits has been in the 314H systolic.  She will check her medications when she gets home.  I have advised her that, if she did take her medications this morning, she should increase hydralazine to 50 mg 3 times a day.  Otherwise, she can continue hydralazine 25 mg 3 times a day along with metoprolol 12.5 mg twice daily, spironolactone 25 mg daily and valsartan 80 mg twice daily.  If her blood pressure continues to be difficult to control, we could increase valsartan to 160 mg twice daily.  I have asked her to send some blood pressure readings for review in a couple weeks.  Follow-up in 6 months.  Atrial fibrillation (HCC) Maintaining sinus rhythm on exam. TSH from [facility] 07/28/2022: 1.94.  Hemoglobin, Creatinine, ALT from [facility] 11/23/21: Hgb 14.2, SCr 0.98, ALT 38. Continue amiodarone 100 mg daily, metoprolol tartrate 12.5 mg twice daily and Eliquis 5 mg twice daily.  HFrEF (heart failure with reduced ejection fraction) (HCC) EF improved to  normal. Volume stable. Continue Metoprolol tartrate 12.5 mg twice daily, Valsartan 80 mg twice daily, Spironolactone 25 mg  once daily.              Dispo:  Return in about 6 months (around 01/31/2023) for Routine Follow Up, w/ Dr. Burt Knack, or Richardson Dopp, PA-C.   Medication Adjustments/Labs and Tests Ordered: Current medicines are reviewed at length with the patient today.  Concerns regarding medicines are outlined above.  Tests Ordered: No orders of the defined types were placed in this encounter.  Medication Changes: No orders of the defined types were placed in this encounter.  Signed, Richardson Dopp, PA-C  08/02/2022 4:37 PM    [address], [place], [place]  [address] Phone: [phone]; Fax: [phone]

## 2022-08-02 NOTE — Assessment & Plan Note (Signed)
EF improved to normal. Volume stable. Continue Metoprolol tartrate 12.5 mg twice daily, Valsartan 80 mg twice daily, Spironolactone 25 mg once daily.

## 2022-08-02 NOTE — Patient Instructions (Signed)
Medication Instructions:  Your physician recommends that you continue on your current medications as directed. Please refer to the Current Medication list given to you today.   BUT, WHEN YOU GET HOME, IF YOU HAVEN'T TAKEN YOUR MEDICATIONS TODAY, TAKE THEM.  IF YOU HAVE TAKEN THEM, INCREASE THE HYDRALAZINE TO 25 MG TAKING 2 TABLETS THREE TIMES A DAY AND CALL THE OFFICE.  *If you need a refill on your cardiac medications before your next appointment, please call your pharmacy*   Lab Work: None ordered  If you have labs (blood work) drawn today and your tests are completely normal, you will receive your results only by: [place] (if you have MyChart) OR A paper copy in the mail If you have any lab test that is abnormal or we need to change your treatment, we will call you to review the results.   Testing/Procedures: None ordered   Follow-Up: At [facility], you and your health needs are our priority.  As part of our continuing mission to provide you with exceptional heart care, we have created designated Provider Care Teams.  These Care Teams include your primary Cardiologist (physician) and Advanced Practice Providers (APPs -  Physician Assistants and Nurse Practitioners) who all work together to provide you with the care you need, when you need it.  We recommend signing up for the patient portal called "MyChart".  Sign up information is provided on this After Visit Summary.  MyChart is used to connect with patients for Virtual Visits (Telemedicine).  Patients are able to view lab/test results, encounter notes, upcoming appointments, etc.  Non-urgent messages can be sent to your provider as well.   To learn more about what you can do with MyChart, go to [email or web address].    Your next appointment:   6 month(s)  The format for your next appointment:   In Person  Provider:   Sherren Mocha, MD  or Richardson Dopp, PA-C         Other Instructions Your physician  has requested that you regularly monitor and record your blood pressure readings at home. Please use the same machine at the same time of day to check your readings and record them to bring to your follow-up visit.   Please monitor blood pressures and keep a log of your readings for 2 weeks then call the readings into the office.    Make sure to check 2 hours after your medications.    AVOID these things for 30 minutes before checking your blood pressure: No Drinking caffeine. No Drinking alcohol. No Eating. No Smoking. No Exercising.   Five minutes before checking your blood pressure: Pee. Sit in a dining chair. Avoid sitting in a soft couch or armchair. Be quiet. Do not talk   Important Information About Sugar

## 2022-08-03 ENCOUNTER — Telehealth: Payer: Self-pay | Admitting: Physician Assistant

## 2022-08-03 NOTE — Telephone Encounter (Signed)
Pt states yesterday at her appt she was told to c/b to let Kathlen Mody

## 2022-08-03 NOTE — Telephone Encounter (Signed)
Please excuse previous encounter. Computer cut off while typing.    Pt was told to c/b to let Michelle Lake, PA know that she did not take her BP medication yesterday.

## 2022-08-03 NOTE — Telephone Encounter (Signed)
Returned call to pt.  Left a message for her to call back.

## 2022-08-09 ENCOUNTER — Telehealth: Payer: Self-pay | Admitting: Cardiovascular Disease

## 2022-08-09 NOTE — Telephone Encounter (Signed)
Pt c/o medication issue:  1. Name of Medication:   ELIQUIS 5 MG TABS tablet    2. How are you currently taking this medication (dosage and times per day)?   TAKE 1 TABLET BY MOUTH TWICE A DAY    3. Are you having a reaction (difficulty breathing--STAT)? No  4. What is your medication issue? Pt states that coupon she was given to help with cost of medication has ran out. Pt would like to know if she is able to get another coupon. Please advise

## 2022-08-11 NOTE — Telephone Encounter (Signed)
Called and spoke with patient. She describes a savings coupon that sounds like the $10/month offer, but she is a Information systems manager recipient. I informed her that since she is on medicare, she is excluded from this coupon. However, since she states she was using one, I gave her the 800-number to the [employer] listed on the website and asked her to call them. Also spoke in detail about the patient assistance application process and benefits if they tell her she cannot get the monthly coupon. Both her and her husband use Eliquis and husband she states is also on Jardiance and medication costs are getting expensive for them. Told her I feel like with three brand name medications, that she would be eligible for patient assistance. She will call back if she needs [place].

## 2022-10-31 ENCOUNTER — Other Ambulatory Visit: Payer: Self-pay | Admitting: Cardiovascular Disease

## 2022-11-02 ENCOUNTER — Other Ambulatory Visit: Payer: Self-pay | Admitting: Physician Assistant

## 2022-11-03 ENCOUNTER — Telehealth: Payer: Self-pay

## 2022-11-03 MED ORDER — HYDRALAZINE HCL 25 MG PO TABS: 50.0000 mg | ORAL_TABLET | Freq: Three times a day (TID) | ORAL | 3 refills | Status: DC

## 2022-11-03 NOTE — Telephone Encounter (Signed)
Pt calling stating that Renee Rival, PA told her at her last office visit to increased medication hydralazine 25 to 2 tablets TID. This was not done. Pt would like a call back concerning this matter. Please address

## 2022-11-03 NOTE — Telephone Encounter (Signed)
Pt called requesting a refill on the Hydralazine 25 mg 2 tablets three times a day. She states at her last office visit, she realized when she returned home, she had taken the Hydralazine and went ahead an increased it as she was instructed, but forgot to call and let us know.  Pt did state that she checks her bp, and it is at a great number, but she hasn't been keeping a record of them.  Per Richardson Dopp, PA-C's last office note, sent in a refill for Hydralazine 25 mg 2 tablets three times a day to [facility], [facility], per pt's request.

## 2022-11-04 ENCOUNTER — Encounter (HOSPITAL_COMMUNITY): Payer: Self-pay | Admitting: *Deleted

## 2022-12-10 NOTE — Telephone Encounter (Signed)
error 

## 2022-12-13 ENCOUNTER — Other Ambulatory Visit: Payer: Self-pay | Admitting: Cardiovascular Disease

## 2022-12-13 DIAGNOSIS — I5020 Unspecified systolic (congestive) heart failure: Secondary | ICD-10-CM

## 2022-12-13 DIAGNOSIS — I10 Essential (primary) hypertension: Secondary | ICD-10-CM

## 2023-01-26 ENCOUNTER — Ambulatory Visit: Payer: Medicare Other | Attending: Cardiovascular Disease | Admitting: Cardiovascular Disease

## 2023-01-26 ENCOUNTER — Encounter: Payer: Self-pay | Admitting: Cardiovascular Disease

## 2023-01-26 VITALS — BP 120/60 | HR 56 | Ht 60.0 in | Wt 214.6 lb

## 2023-01-26 DIAGNOSIS — I5032 Chronic diastolic (congestive) heart failure: Secondary | ICD-10-CM

## 2023-01-26 DIAGNOSIS — I4819 Other persistent atrial fibrillation: Secondary | ICD-10-CM

## 2023-01-26 DIAGNOSIS — Z7901 Long term (current) use of anticoagulants: Secondary | ICD-10-CM

## 2023-01-26 DIAGNOSIS — Z79899 Other long term (current) drug therapy: Secondary | ICD-10-CM | POA: Diagnosis not present

## 2023-01-26 MED ORDER — AMIODARONE HCL 100 MG PO TABS
ORAL_TABLET | ORAL | 3 refills | Status: DC
Start: 2023-01-26 — End: 2023-10-25

## 2023-01-26 NOTE — Progress Notes (Signed)
Cardiology Office Note:    Date:  01/26/2023   ID:  Michelle Watkins, DOB Jun 30, 1946, MRN [id number]  PCP:  Joycelyn Rua, MD   [facility] Providers Cardiologist:  Tonny Bollman, MD Cardiology APP:  Kennon Rounds     Referring MD: Joycelyn Rua, MD   Chief Complaint  Patient presents with   Atrial Fibrillation    History of Present Illness:    Michelle Watkins is a 77 y.o. female with a hx of: Persistent Atrial fibrillation S/p DCCV 07/2020 Admx in [place], [place] w AF w RVR in 08/2020>>Amio/DCCV Monitor 01/2021: NSR, average heart rate 57, <1% PACs/PVCs; short supraventricular runs HFimpEF (heart failure with improved ejection fraction)  Tachycardia mediated cardiomyopathy Echo 07/2020: EF 30-35, global HK, low normal RVSF, RVSP 39.6, mild RAE, mod MR  Echo 02/04/2021: EF 69, no RWMA, normal RVSF, normal PASP, mild LAE, trivial MR.   Hypertension Hypothyroidism   The patient is here alone today.  She has been getting along pretty well.  She states that most of her issues are related to her arthritis and osteoporosis.  She had some problems with her low back, hips, and knees.  She denies chest pain, chest pressure, or shortness of breath.  She has occasional ankle swelling.  No orthopnea, PND, or heart palpitations.  She denies any bleeding problems.  Her thyroid function is checked regularly by endocrinology.  States that she has this checked at least twice per year.  She has an annual eye exam.    Past Medical History:  Diagnosis Date   HFrEF (heart failure with reduced ejection fraction) (HCC)    EF 30-35, global HK, low normal RVSF, RVSP 39.6, mild RAE, mod MR // Echo 5/22: EF 69, no RWMA, normal RVSF, mild LAE, trivial MR   Hypertension    Persistent atrial fibrillation (HCC)    Amio Rx//Apixaban Rx // ZIO monitor 6/22: NSR, average heart rate 57; no pauses or atrial fibrillation; short SVT runs without sustained arrhythmia; PACs/PVCs (<1%)    Thyroid  disease     Past Surgical History:  Procedure Laterality Date   APPENDECTOMY     CARDIOVERSION N/A 08/15/2020   Procedure: CARDIOVERSION;  Surgeon: Thurmon Fair, MD;  Location: [facility];  Service: Cardiovascular;  Laterality: N/A;   HYSTERECTOMY ABDOMINAL WITH SALPINGECTOMY      Current Medications: Current Meds  Medication Sig   acetaminophen (TYLENOL) 650 MG CR tablet Take 650 mg by mouth every 8 (eight) hours as needed (for pain).   amiodarone (PACERONE) 100 MG tablet Take 1 tablet by mouth once daily five days per week (skip Tuesdays and Saturdays, take all other days)   ELIQUIS 5 MG TABS tablet TAKE 1 TABLET BY MOUTH TWICE A DAY   glucosamine-chondroitin 500-400 MG tablet Take 1 tablet by mouth daily. Per patient taking 2 tablets daily   hydrALAZINE (APRESOLINE) 25 MG tablet Take 2 tablets (50 mg total) by mouth 3 (three) times daily.   loratadine (CLARITIN) 10 MG tablet Take 10 mg by mouth daily as needed for allergies.   metoprolol tartrate (LOPRESSOR) 25 MG tablet Take 12.5 mg by mouth 2 (two) times daily.   spironolactone (ALDACTONE) 25 MG tablet TAKE 1 TABLET (25 MG TOTAL) BY MOUTH DAILY.   SYNTHROID 125 MCG tablet Take 125 mcg by mouth daily before breakfast.   Turmeric Curcumin 500 MG CAPS Take 500 mg by mouth daily.   valsartan (DIOVAN) 80 MG tablet TAKE 1 TABLET BY MOUTH TWICE A  DAY   [DISCONTINUED] amiodarone (PACERONE) 100 MG tablet Take 1 tablet (100 mg total) by mouth daily.     Allergies:   Amlodipine, Penicillins, and Sulfamethoxazole   Social History   Socioeconomic History   Marital status: Married    Spouse name: Not on file   Number of children: Not on file   Years of education: Not on file   Highest education level: Not on file  Occupational History   Not on file  Tobacco Use   Smoking status: Never   Smokeless tobacco: Never  Substance and Sexual Activity   Alcohol use: Never   Drug use: Never   Sexual activity: Not on file  Other Topics  Concern   Not on file  Social History Narrative   Not on file   Social Determinants of Health   Financial Resource Strain: Not on file  Food Insecurity: Not on file  Transportation Needs: Not on file  Physical Activity: Not on file  Stress: Not on file  Social Connections: Not on file     Family History: The patient's family history is not on file.  ROS:   Please see the history of present illness.    All other systems reviewed and are negative.  EKGs/Labs/Other Studies Reviewed:    The following studies were reviewed today: Cardiac Studies & Procedures       ECHOCARDIOGRAM  ECHOCARDIOGRAM LIMITED 02/04/2021  Narrative ECHOCARDIOGRAM LIMITED REPORT    Patient Name:   Michelle Watkins Date of Exam: 02/04/2021 Medical Rec #:  [id number]      Height:       63.0 in Accession #:    [id number]     Weight:       216.2 lb Date of Birth:  Apr 06, 1946      BSA:          1.999 m Patient Age:    74 years       BP:           132/72 mmHg Patient Gender: F              HR:           54 bpm. Exam Location:  [address]  Procedure: Limited Echo, 3D Echo, Cardiac Doppler and Limited Color Doppler  Indications:    I48.19 Atrial Fibrillation  History:        Patient has prior history of Echocardiogram examinations, most recent 08/13/2020. Arrythmias:Atrial Fibrillation; Risk Factors:Hypertension. Edema, HFrEF.  Sonographer:    Farrel Conners RDCS Referring Phys: [id number] Evern Bio WEAVER  IMPRESSIONS   1. Left ventricular ejection fraction by 3D volume is 69 %. The left ventricle has no regional wall motion abnormalities. 2. Right ventricular systolic function is normal. The right ventricular size is normal. There is normal pulmonary artery systolic pressure. 3. Left atrial size was mildly dilated. 4. The mitral valve is normal in structure. Trivial mitral valve regurgitation. No evidence of mitral stenosis. 5. The aortic valve is normal in structure. Aortic valve regurgitation is  not visualized. No aortic stenosis is present. 6. The inferior vena cava is normal in size with greater than 50% respiratory variability, suggesting right atrial pressure of 3 mmHg.  FINDINGS Left Ventricle: Left ventricular ejection fraction by 3D volume is 69 %. The left ventricle has no regional wall motion abnormalities. The left ventricular internal cavity size was normal in size. There is no left ventricular hypertrophy.  Right Ventricle: The right ventricular size is normal. No increase in  right ventricular wall thickness. Right ventricular systolic function is normal. There is normal pulmonary artery systolic pressure. The tricuspid regurgitant velocity is 2.64 m/s, and with an assumed right atrial pressure of 3 mmHg, the estimated right ventricular systolic pressure is 30.9 mmHg.  Left Atrium: Left atrial size was mildly dilated.  Right Atrium: Right atrial size was not assessed.  Pericardium: There is no evidence of pericardial effusion.  Mitral Valve: The mitral valve is normal in structure. Mild mitral annular calcification. Trivial mitral valve regurgitation. No evidence of mitral valve stenosis. MV peak gradient, 9.0 mmHg. The mean mitral valve gradient is 3.0 mmHg.  Tricuspid Valve: The tricuspid valve is normal in structure. Tricuspid valve regurgitation is not demonstrated. No evidence of tricuspid stenosis.  Aortic Valve: The aortic valve is normal in structure. Aortic valve regurgitation is not visualized. No aortic stenosis is present.  Pulmonic Valve: The pulmonic valve was normal in structure. Pulmonic valve regurgitation is not visualized. No evidence of pulmonic stenosis.  Aorta: The aortic root is normal in size and structure.  Venous: The inferior vena cava is normal in size with greater than 50% respiratory variability, suggesting right atrial pressure of 3 mmHg.  IAS/Shunts: No atrial level shunt detected by color flow Doppler.  LEFT VENTRICLE PLAX 2D LVIDd:          4.15 cm         Diastology LVIDs:         2.50 cm         LV e' medial:    7.62 cm/s LV PW:         0.75 cm         LV E/e' medial:  18.9 LV IVS:        0.75 cm         LV e' lateral:   8.59 cm/s LVOT diam:     2.00 cm         LV E/e' lateral: 16.8 LV SV:         85 LV SV Index:   42 LVOT Area:     3.14 cm        3D Volume EF LV 3D EF:    Left ventricular ejection fraction by 3D volume is 69 %.  3D Volume EF: 3D EF:        69 % LV EDV:       114 ml LV ESV:       35 ml LV SV:        78 ml  RIGHT VENTRICLE RV S prime:     14.80 cm/s TAPSE (M-mode): 2.5 cm  LEFT ATRIUM             Index       RIGHT ATRIUM           Index LA diam:        4.30 cm 2.15 cm/m  RA Area:     14.90 cm LA Vol (A2C):   76.5 ml 38.27 ml/m RA Volume:   36.70 ml  18.36 ml/m LA Vol (A4C):   70.1 ml 35.07 ml/m LA Biplane Vol: 73.1 ml 36.57 ml/m AORTIC VALVE LVOT Vmax:   97.40 cm/s LVOT Vmean:  63.350 cm/s LVOT VTI:    0.269 m  AORTA Ao Root diam: 3.50 cm Ao Asc diam:  3.30 cm  MITRAL VALVE                TRICUSPID VALVE MV Area (PHT): 1.59  cm     TR Peak grad:   27.9 mmHg MV Area VTI:   1.46 cm     TR Vmax:        264.00 cm/s MV Peak grad:  9.0 mmHg MV Mean grad:  3.0 mmHg     SHUNTS MV Vmax:       1.50 m/s     Systemic VTI:  0.27 m MV Vmean:      70.7 cm/s    Systemic Diam: 2.00 cm MV Decel Time: 346 msec MR Peak grad: 124.5 mmHg MR Mean grad: 80.0 mmHg MR Vmax:      558.00 cm/s MR Vmean:     417.0 cm/s MV E velocity: 144.00 cm/s MV A velocity: 120.50 cm/s MV E/A ratio:  1.20  Donato Schultz MD Electronically signed by Donato Schultz MD Signature Date/Time: 02/04/2021/4:35:51 PM    Final    MONITORS  LONG TERM MONITOR-LIVE TELEMETRY (3-14 DAYS) 03/03/2021  Narrative Patch Wear Time:  13 days and 23 hours (2022-05-05T15:55:02-399 to 2022-05-19T15:51:03-399)  Patient had a min HR of 41 bpm, max HR of 160 bpm, and avg HR of 57 bpm. Predominant underlying rhythm was Sinus  Rhythm. 92 Supraventricular Tachycardia runs occurred, the run with the fastest interval lasting 7 beats with a max rate of 160 bpm, the longest lasting 19.0 secs with an avg rate of 100 bpm. Isolated SVEs were rare (<1.0%), SVE Couplets were rare (<1.0%), and SVE Triplets were rare (<1.0%). Isolated VEs were rare (<1.0%, 157), VE Couplets were rare (<1.0%, 9), and VE Triplets were rare (<1.0%, 2).  Summary:  The basic rhythm is normal sinus with an average HR of 57 bpm  Supraventricular and ventricular ectopics are rare (<1% burden)  There are short supraventricular runs present, without sustained SVT            EKG:  EKG is not ordered today.    Recent Labs: No results found for requested labs within last 365 days.  Recent Lipid Panel No results found for: "CHOL", "TRIG", "HDL", "CHOLHDL", "VLDL", "LDLCALC", "LDLDIRECT"   Risk Assessment/Calculations:    CHA2DS2-VASc Score = 5   This indicates a 7.2% annual risk of stroke. The patient's score is based upon: CHF History: 1 HTN History: 1 Diabetes History: 0 Stroke History: 0 Vascular Disease History: 0 Age Score: 2 Gender Score: 1               Physical Exam:    VS:  BP 120/60   Pulse (!) 56   Ht 5' (1.524 m)   Wt 214 lb 9.6 oz (97.3 kg)   SpO2 96%   BMI 41.91 kg/m     Wt Readings from Last 3 Encounters:  01/26/23 214 lb 9.6 oz (97.3 kg)  08/02/22 220 lb 6.4 oz (100 kg)  05/21/22 220 lb 12.8 oz (100.2 kg)     GEN:  Well nourished, well developed in no acute distress HEENT: Normal NECK: No JVD; No carotid bruits LYMPHATICS: No lymphadenopathy CARDIAC: RRR, 2/6 systolic murmur at the right upper sternal border. RESPIRATORY:  Clear to auscultation without rales, wheezing or rhonchi  ABDOMEN: Soft, non-tender, non-distended MUSCULOSKELETAL:  No edema; No deformity  SKIN: Warm and dry NEUROLOGIC:  Alert and oriented x 3 PSYCHIATRIC:  Normal affect   ASSESSMENT:    1. Persistent atrial fibrillation  (HCC)   2. HFimpEF (heart failure w improved EF)   3. On amiodarone therapy   4. Chronic anticoagulation    PLAN:  In order of problems listed above:  The patient appears to be tolerating a rhythm control strategy with low-dose amiodarone.  Will reduce her dose to 100 mg 5 days/week.  She will take amiodarone daily except for Tuesdays and Saturdays.  She continues on apixaban and reports no bleeding problems.  Will check a CBC and metabolic panel today.  We discussed rhythm management strategies, issues with surveillance of long-term amiodarone, and alternatives.  I discussed referral to EP for consideration of catheter ablation to potentially avoid long-term amiodarone.  After a shared decision-making conversation, we decided to continue with amiodarone at a reduced dose as she is tolerating it well.  She has annual eye exams, regular endocrine follow-up for her thyroid, and we will update her labs today.  Will also check a chest x-ray to screen for any lung disease. Appears to be doing well.  With atrial fibrillation her LVEF dropped to 35%, but with restoration of sinus rhythm this normalized.  She continues on valsartan, spironolactone, and metoprolol. Check labs and chest x-ray as above. No bleeding problems reported.  Check CBC today.           Medication Adjustments/Labs and Tests Ordered: Current medicines are reviewed at length with the patient today.  Concerns regarding medicines are outlined above.  Orders Placed This Encounter  Procedures   DG Chest 2 View   CBC   Comprehensive metabolic panel   Meds ordered this encounter  Medications   amiodarone (PACERONE) 100 MG tablet    Sig: Take 1 tablet by mouth once daily five days per week (skip Tuesdays and Saturdays, take all other days)    Dispense:  60 tablet    Refill:  3    Dose adjustment    Patient Instructions  Medication Instructions:  DECREASE Amiodarone to 100mg  five days per week (Skip Tuesdays and  Saturdays) *If you need a refill on your cardiac medications before your next appointment, please call your pharmacy*  Lab Work: CBC, CMET today If you have labs (blood work) drawn today and your tests are completely normal, you will receive your results only by: MyChart Message (if you have MyChart) OR A paper copy in the mail If you have any lab test that is abnormal or we need to change your treatment, we will call you to review the results.  Testing/Procedures: Chest X-ray A chest x-ray takes a picture of the organs and structures inside the chest, including the heart, lungs, and blood vessels. This test can show several things, including, whether the heart is enlarges; whether fluid is building up in the lungs; and whether pacemaker / defibrillator leads are still in place.  Follow-Up: At [facility], you and your health needs are our priority.  As part of our continuing mission to provide you with exceptional heart care, we have created designated Provider Care Teams.  These Care Teams include your primary Cardiologist (physician) and Advanced Practice Providers (APPs -  Physician Assistants and Nurse Practitioners) who all work together to provide you with the care you need, when you need it.  We recommend signing up for the patient portal called "MyChart".  Sign up information is provided on this After Visit Summary.  MyChart is used to connect with patients for Virtual Visits (Telemedicine).  Patients are able to view lab/test results, encounter notes, upcoming appointments, etc.  Non-urgent messages can be sent to your provider as well.   To learn more about what you can do with MyChart, go to  [email or web address].    Your next appointment:   6 month(s)  Provider:   Jari Favre, PA-C, Ronie Spies, PA-C, Robin Searing, NP, Eligha Bridegroom, NP, or Tereso Newcomer, PA-C     Then, Tonny Bollman, MD will plan to see you again in 1 year(s).       Signed, Tonny Bollman, MD   01/26/2023 5:51 PM    [facility]

## 2023-01-26 NOTE — Patient Instructions (Signed)
Medication Instructions:  DECREASE Amiodarone to 100mg  five days per week (Skip Tuesdays and Saturdays) *If you need a refill on your cardiac medications before your next appointment, please call your pharmacy*  Lab Work: CBC, CMET today If you have labs (blood work) drawn today and your tests are completely normal, you will receive your results only by: MyChart Message (if you have MyChart) OR A paper copy in the mail If you have any lab test that is abnormal or we need to change your treatment, we will call you to review the results.  Testing/Procedures: Chest X-ray A chest x-ray takes a picture of the organs and structures inside the chest, including the heart, lungs, and blood vessels. This test can show several things, including, whether the heart is enlarges; whether fluid is building up in the lungs; and whether pacemaker / defibrillator leads are still in place.  Follow-Up: At [facility], you and your health needs are our priority.  As part of our continuing mission to provide you with exceptional heart care, we have created designated Provider Care Teams.  These Care Teams include your primary Cardiologist (physician) and Advanced Practice Providers (APPs -  Physician Assistants and Nurse Practitioners) who all work together to provide you with the care you need, when you need it.  We recommend signing up for the patient portal called "MyChart".  Sign up information is provided on this After Visit Summary.  MyChart is used to connect with patients for Virtual Visits (Telemedicine).  Patients are able to view lab/test results, encounter notes, upcoming appointments, etc.  Non-urgent messages can be sent to your provider as well.   To learn more about what you can do with MyChart, go to [email or web address].    Your next appointment:   6 month(s)  Provider:   Jari Favre, PA-C, Ronie Spies, PA-C, Robin Searing, NP, Eligha Bridegroom, NP, or Tereso Newcomer, PA-C     Then,  Tonny Bollman, MD will plan to see you again in 1 year(s).

## 2023-01-27 LAB — COMPREHENSIVE METABOLIC PANEL
ALT: 29 IU/L (ref 0–32)
AST: 24 IU/L (ref 0–40)
Albumin/Globulin Ratio: 1.5 (ref 1.2–2.2)
Albumin: 4.3 g/dL (ref 3.8–4.8)
Alkaline Phosphatase: 77 IU/L (ref 44–121)
BUN/Creatinine Ratio: 18 (ref 12–28)
BUN: 19 mg/dL (ref 8–27)
Bilirubin Total: 0.2 mg/dL (ref 0.0–1.2)
CO2: 22 mmol/L (ref 20–29)
Calcium: 9.3 mg/dL (ref 8.7–10.3)
Chloride: 102 mmol/L (ref 96–106)
Creatinine, Ser: 1.08 mg/dL — ABNORMAL HIGH (ref 0.57–1.00)
Globulin, Total: 2.9 g/dL (ref 1.5–4.5)
Glucose: 131 mg/dL — ABNORMAL HIGH (ref 70–99)
Potassium: 4.7 mmol/L (ref 3.5–5.2)
Sodium: 138 mmol/L (ref 134–144)
Total Protein: 7.2 g/dL (ref 6.0–8.5)
eGFR: 53 mL/min/{1.73_m2} — ABNORMAL LOW (ref >59)

## 2023-01-27 LAB — CBC
Hematocrit: 40.6 % (ref 34.0–46.6)
Hemoglobin: 13 g/dL (ref 11.1–15.9)
MCH: 29.8 pg (ref 26.6–33.0)
MCHC: 32 g/dL (ref 31.5–35.7)
MCV: 93 fL (ref 79–97)
Platelets: 308 10*3/uL (ref 150–450)
RBC: 4.36 x10E6/uL (ref 3.77–5.28)
RDW: 13.2 % (ref 11.7–15.4)
WBC: 7.2 10*3/uL (ref 3.4–10.8)

## 2023-01-28 ENCOUNTER — Other Ambulatory Visit: Payer: Self-pay | Admitting: Cardiovascular Disease

## 2023-02-01 ENCOUNTER — Ambulatory Visit
Admission: RE | Admit: 2023-02-01 | Discharge: 2023-02-01 | Disposition: A | Discharge status: Home / Self Care | Payer: Medicare Other | Source: Ambulatory Visit | Attending: Cardiovascular Disease | Admitting: Cardiovascular Disease

## 2023-02-01 DIAGNOSIS — I4819 Other persistent atrial fibrillation: Secondary | ICD-10-CM

## 2023-02-01 DIAGNOSIS — Z7901 Long term (current) use of anticoagulants: Secondary | ICD-10-CM

## 2023-02-01 DIAGNOSIS — Z79899 Other long term (current) drug therapy: Secondary | ICD-10-CM

## 2023-02-01 DIAGNOSIS — I5032 Chronic diastolic (congestive) heart failure: Secondary | ICD-10-CM

## 2023-06-02 ENCOUNTER — Other Ambulatory Visit: Payer: Self-pay | Admitting: Family Medicine

## 2023-06-02 DIAGNOSIS — Z1231 Encounter for screening mammogram for malignant neoplasm of breast: Secondary | ICD-10-CM

## 2023-06-08 ENCOUNTER — Other Ambulatory Visit: Payer: Self-pay | Admitting: Physician Assistant

## 2023-06-08 NOTE — Telephone Encounter (Signed)
Please advise if ok to refill Historical medication. 

## 2023-06-14 ENCOUNTER — Ambulatory Visit
Admission: RE | Admit: 2023-06-14 | Discharge: 2023-06-14 | Disposition: A | Discharge status: Home / Self Care | Payer: Medicare Other | Source: Ambulatory Visit | Attending: Family Medicine | Admitting: Family Medicine

## 2023-06-14 DIAGNOSIS — Z1231 Encounter for screening mammogram for malignant neoplasm of breast: Secondary | ICD-10-CM

## 2023-08-03 ENCOUNTER — Other Ambulatory Visit: Payer: Self-pay | Admitting: Cardiovascular Disease

## 2023-08-03 DIAGNOSIS — I4819 Other persistent atrial fibrillation: Secondary | ICD-10-CM

## 2023-08-03 MED ORDER — APIXABAN 5 MG PO TABS
5.0000 mg | ORAL_TABLET | Freq: Two times a day (BID) | ORAL | 1 refills | Status: DC
Start: 2023-08-03 — End: 2024-01-30

## 2023-08-03 NOTE — Telephone Encounter (Signed)
Prescription refill request for Eliquis received. Indication: Afib  Last office visit: 01/26/23 Excell Seltzer)  Scr: 1.08 (01/26/23)  Age: 77 Weight: 97.3kg  Appropriate dose. Refill sent.

## 2023-08-03 NOTE — Telephone Encounter (Signed)
Eliquis refill sent today by another user to the same pharmacy. This is a duplicate will not resend as confirmation received.

## 2023-08-03 NOTE — Telephone Encounter (Signed)
*  STAT* If patient is at the pharmacy, call can be transferred to refill team.   1. Which medications need to be refilled? (please list name of each medication and dose if known) ELIQUIS 5 MG TABS tablet   2. Which pharmacy/location (including street and city if local pharmacy) is medication to be sent to?  [id number] - [place], [place] - [address]    3. Do they need a 30 day or 90 day supply? 90

## 2023-09-06 ENCOUNTER — Other Ambulatory Visit: Payer: Self-pay | Admitting: Cardiovascular Disease

## 2023-09-06 DIAGNOSIS — I5020 Unspecified systolic (congestive) heart failure: Secondary | ICD-10-CM

## 2023-09-06 DIAGNOSIS — I10 Essential (primary) hypertension: Secondary | ICD-10-CM

## 2023-10-23 ENCOUNTER — Other Ambulatory Visit: Payer: Self-pay | Admitting: Cardiovascular Disease

## 2023-10-23 DIAGNOSIS — Z79899 Other long term (current) drug therapy: Secondary | ICD-10-CM

## 2023-10-23 DIAGNOSIS — I5032 Chronic diastolic (congestive) heart failure: Secondary | ICD-10-CM

## 2023-10-23 DIAGNOSIS — Z7901 Long term (current) use of anticoagulants: Secondary | ICD-10-CM

## 2023-10-23 DIAGNOSIS — I4819 Other persistent atrial fibrillation: Secondary | ICD-10-CM

## 2023-12-17 ENCOUNTER — Other Ambulatory Visit: Payer: Self-pay | Admitting: Physician Assistant

## 2023-12-22 ENCOUNTER — Other Ambulatory Visit: Payer: Self-pay | Admitting: Cardiovascular Disease

## 2024-01-21 ENCOUNTER — Other Ambulatory Visit: Payer: Self-pay | Admitting: Cardiovascular Disease

## 2024-01-30 ENCOUNTER — Ambulatory Visit: Payer: Medicare Other | Attending: Cardiovascular Disease | Admitting: Cardiovascular Disease

## 2024-01-30 ENCOUNTER — Encounter: Payer: Self-pay | Admitting: Cardiovascular Disease

## 2024-01-30 ENCOUNTER — Other Ambulatory Visit: Payer: Self-pay | Admitting: Cardiovascular Disease

## 2024-01-30 ENCOUNTER — Telehealth: Payer: Self-pay | Admitting: Cardiovascular Disease

## 2024-01-30 VITALS — BP 150/80 | HR 51 | Ht 62.0 in | Wt 206.6 lb

## 2024-01-30 DIAGNOSIS — I4819 Other persistent atrial fibrillation: Secondary | ICD-10-CM

## 2024-01-30 DIAGNOSIS — I5032 Chronic diastolic (congestive) heart failure: Secondary | ICD-10-CM

## 2024-01-30 DIAGNOSIS — I10 Essential (primary) hypertension: Secondary | ICD-10-CM | POA: Diagnosis not present

## 2024-01-30 MED ORDER — METOPROLOL TARTRATE 25 MG PO TABS: 12.5000 mg | ORAL_TABLET | Freq: Two times a day (BID) | ORAL | 3 refills | Status: DC

## 2024-01-30 NOTE — Assessment & Plan Note (Addendum)
 Tolerating oral amiodarone  for long-term rhythm control strategy and oral anticoagulation with apixaban .  Reports no bleeding problems.  We talked about various treatment strategies including switching her from amiodarone  to Tikosyn to reduce long-term potential drug toxicity, consideration of catheter ablation, or being off of antiarrhythmic drug therapy altogether.  The patient did have significant symptoms and deterioration of LV function with A-fib.  I think she is going to require either drug therapy or ablation to stay in sinus rhythm considering her advancing age and obesity.  She is only on amiodarone  100 mg 5 days/week and she seems to be tolerating it quite well.  She has annual eye exams, regular thyroid  function test with stable thyroid  function, and serial labs.  I recommended continuing her current medical therapy.  I will check a CBC and complete metabolic panel today.  She continues to follow with endocrinology for her thyroid  function.

## 2024-01-30 NOTE — Telephone Encounter (Signed)
*  STAT* If patient is at the pharmacy, call can be transferred to refill team.   1. Which medications need to be refilled? (please list name of each medication and dose if known) apixaban  (ELIQUIS ) 5 MG TABS tablet     4. Which pharmacy/location (including street and city if local pharmacy) is medication to be sent to? [id number] - [place], [place] - [address]     5. Do they need a 30 day or 90 day supply? 90   Pt scheduled for today 5/5 at 3:00 with Dr. Arlester Ladd

## 2024-01-30 NOTE — Telephone Encounter (Signed)
 Prescription refill request for Eliquis  received. Indication:afib Last office visit:upcoming Scr:1.08  5/24 Age: 78 Weight:97.3  kg  Prescription refilled

## 2024-01-30 NOTE — Progress Notes (Signed)
 Cardiology Office Note:    Date:  02/05/2024   ID:  Michelle Watkins, DOB 1946/01/03, MRN [id number]  PCP:  Wyn Heater, MD   [facility] Providers Cardiologist:  Arnoldo Lapping, MD Cardiology APP:  [facility] Don[facility]     Referring MD: Wyn Heater, MD   Chief Complaint  Patient presents with   Follow-up    AFib    History of Present Illness:    Michelle Watkins is a 78 y.o. female with a hx of:  Persistent Atrial fibrillation S/p DCCV 07/2020 Admx in [place], [place] w AF w RVR in 08/2020>>Amio/DCCV Monitor 01/2021: NSR, average heart rate 57, <1% PACs/PVCs; short supraventricular runs HFimpEF (heart failure with improved ejection fraction)  Tachycardia mediated cardiomyopathy Echo 07/2020: EF 30-35, global HK, low normal RVSF, RVSP 39.6, mild RAE, mod MR  Echo 02/04/2021: EF 69, no RWMA, normal RVSF, normal PASP, mild LAE, trivial MR.   Hypertension Hypothyroidism   Patient is here alone today.  She has been managed with a rhythm control strategy using amiodarone .  She has had regular eye exams, close endocrinology follow-up for her thyroid  function, and has undergone routine lab monitoring. Her office BP is elevated today but she states that before she left home her BP was 116/66 mmHg and it normally runs in a good range like that. Today, she denies symptoms of palpitations, chest pain, shortness of breath, orthopnea, PND, lower extremity edema, dizziness, or syncope. She is limited by arthritis with both knee and hip pain.    Current Medications: Current Meds  Medication Sig   acetaminophen (TYLENOL) 650 MG CR tablet Take 650 mg by mouth every 8 (eight) hours as needed (for pain).   amiodarone  (PACERONE ) 100 MG tablet TAKE 1 TABLET BY MOUTH ONCE DAILY FIVE DAYS PER WEEK (SKIP TUESDAYS AND SATURDAYS, TAKE ALL OTHER DAYS)   ELIQUIS  5 MG TABS tablet TAKE 1 TABLET BY MOUTH TWICE A DAY   glucosamine-chondroitin 500-400 MG tablet Take 1 tablet by mouth daily.  Per patient taking 2 tablets daily   hydrALAZINE  (APRESOLINE ) 25 MG tablet TAKE 2 TABLETS (50 MG TOTAL) BY MOUTH 3 (THREE) TIMES DAILY.   loratadine (CLARITIN) 10 MG tablet Take 10 mg by mouth daily as needed for allergies.   metoprolol  tartrate (LOPRESSOR ) 25 MG tablet Take 0.5 tablets (12.5 mg total) by mouth 2 (two) times daily.   OVER THE COUNTER MEDICATION Calcium 600 mg plus Vit. D 20 mcg taking 1 tablet twice a day   spironolactone  (ALDACTONE ) 25 MG tablet TAKE 1 TABLET (25 MG TOTAL) BY MOUTH DAILY.   SYNTHROID 125 MCG tablet Take 125 mcg by mouth daily before breakfast. Taking 2 tablets on Sunday.   valsartan  (DIOVAN ) 80 MG tablet Take 1 tablet (80 mg total) by mouth 2 (two) times daily. KEEP OV.   [DISCONTINUED] metoprolol  tartrate (LOPRESSOR ) 25 MG tablet TAKE 1 TABLET BY MOUTH TWICE A DAY (Patient taking differently: Take 25 mg by mouth 2 (two) times daily. Taking 1/2 tablet twice daily)   [DISCONTINUED] Turmeric Curcumin 500 MG CAPS Take 500 mg by mouth daily.     Allergies:   Amlodipine , Penicillins, and Sulfamethoxazole   ROS:   Please see the history of present illness.    All other systems reviewed and are negative.  EKGs/Labs/Other Studies Reviewed:    The following studies were reviewed today: Cardiac Studies & Procedures   ______________________________________________________________________________________________     ECHOCARDIOGRAM  ECHOCARDIOGRAM LIMITED 02/04/2021  Narrative ECHOCARDIOGRAM LIMITED REPORT  Patient Name:   Michelle Watkins Date of Exam: 02/04/2021 Medical Rec #:  [id number]      Height:       63.0 in Accession #:    [id number]     Weight:       216.2 lb Date of Birth:  September 07, 1946      BSA:          1.999 m Patient Age:    74 years       BP:           132/72 mmHg Patient Gender: F              HR:           54 bpm. Exam Location:  [address]  Procedure: Limited Echo, 3D Echo, Cardiac Doppler and Limited Color Doppler  Indications:     I48.19 Atrial Fibrillation  History:        Patient has prior history of Echocardiogram examinations, most recent 08/13/2020. Arrythmias:Atrial Fibrillation; Risk Factors:Hypertension. Edema, HFrEF.  Sonographer:    Ewing Holiday RDCS Referring Phys: [id number] Awilda Bogus WEAVER  IMPRESSIONS   1. Left ventricular ejection fraction by 3D volume is 69 %. The left ventricle has no regional wall motion abnormalities. 2. Right ventricular systolic function is normal. The right ventricular size is normal. There is normal pulmonary artery systolic pressure. 3. Left atrial size was mildly dilated. 4. The mitral valve is normal in structure. Trivial mitral valve regurgitation. No evidence of mitral stenosis. 5. The aortic valve is normal in structure. Aortic valve regurgitation is not visualized. No aortic stenosis is present. 6. The inferior vena cava is normal in size with greater than 50% respiratory variability, suggesting right atrial pressure of 3 mmHg.  FINDINGS Left Ventricle: Left ventricular ejection fraction by 3D volume is 69 %. The left ventricle has no regional wall motion abnormalities. The left ventricular internal cavity size was normal in size. There is no left ventricular hypertrophy.  Right Ventricle: The right ventricular size is normal. No increase in right ventricular wall thickness. Right ventricular systolic function is normal. There is normal pulmonary artery systolic pressure. The tricuspid regurgitant velocity is 2.64 m/s, and with an assumed right atrial pressure of 3 mmHg, the estimated right ventricular systolic pressure is 30.9 mmHg.  Left Atrium: Left atrial size was mildly dilated.  Right Atrium: Right atrial size was not assessed.  Pericardium: There is no evidence of pericardial effusion.  Mitral Valve: The mitral valve is normal in structure. Mild mitral annular calcification. Trivial mitral valve regurgitation. No evidence of mitral valve stenosis. MV peak  gradient, 9.0 mmHg. The mean mitral valve gradient is 3.0 mmHg.  Tricuspid Valve: The tricuspid valve is normal in structure. Tricuspid valve regurgitation is not demonstrated. No evidence of tricuspid stenosis.  Aortic Valve: The aortic valve is normal in structure. Aortic valve regurgitation is not visualized. No aortic stenosis is present.  Pulmonic Valve: The pulmonic valve was normal in structure. Pulmonic valve regurgitation is not visualized. No evidence of pulmonic stenosis.  Aorta: The aortic root is normal in size and structure.  Venous: The inferior vena cava is normal in size with greater than 50% respiratory variability, suggesting right atrial pressure of 3 mmHg.  IAS/Shunts: No atrial level shunt detected by color flow Doppler.  LEFT VENTRICLE PLAX 2D LVIDd:         4.15 cm         Diastology LVIDs:  2.50 cm         LV e' medial:    7.62 cm/s LV PW:         0.75 cm         LV E/e' medial:  18.9 LV IVS:        0.75 cm         LV e' lateral:   8.59 cm/s LVOT diam:     2.00 cm         LV E/e' lateral: 16.8 LV SV:         85 LV SV Index:   42 LVOT Area:     3.14 cm        3D Volume EF LV 3D EF:    Left ventricular ejection fraction by 3D volume is 69 %.  3D Volume EF: 3D EF:        69 % LV EDV:       114 ml LV ESV:       35 ml LV SV:        78 ml  RIGHT VENTRICLE RV S prime:     14.80 cm/s TAPSE (M-mode): 2.5 cm  LEFT ATRIUM             Index       RIGHT ATRIUM           Index LA diam:        4.30 cm 2.15 cm/m  RA Area:     14.90 cm LA Vol (A2C):   76.5 ml 38.27 ml/m RA Volume:   36.70 ml  18.36 ml/m LA Vol (A4C):   70.1 ml 35.07 ml/m LA Biplane Vol: 73.1 ml 36.57 ml/m AORTIC VALVE LVOT Vmax:   97.40 cm/s LVOT Vmean:  63.350 cm/s LVOT VTI:    0.269 m  AORTA Ao Root diam: 3.50 cm Ao Asc diam:  3.30 cm  MITRAL VALVE                TRICUSPID VALVE MV Area (PHT): 1.59 cm     TR Peak grad:   27.9 mmHg MV Area VTI:   1.46 cm     TR Vmax:         264.00 cm/s MV Peak grad:  9.0 mmHg MV Mean grad:  3.0 mmHg     SHUNTS MV Vmax:       1.50 m/s     Systemic VTI:  0.27 m MV Vmean:      70.7 cm/s    Systemic Diam: 2.00 cm MV Decel Time: 346 msec MR Peak grad: 124.5 mmHg MR Mean grad: 80.0 mmHg MR Vmax:      558.00 cm/s MR Vmean:     417.0 cm/s MV E velocity: 144.00 cm/s MV A velocity: 120.50 cm/s MV E/A ratio:  1.20  Dorothye Gathers MD Electronically signed by Dorothye Gathers MD Signature Date/Time: 02/04/2021/4:35:51 PM    Final    MONITORS  LONG TERM MONITOR-LIVE TELEMETRY (3-14 DAYS) 02/17/2021  Narrative Patch Wear Time:  13 days and 23 hours (2022-05-05T15:55:02-399 to 2022-05-19T15:51:03-399)  Patient had a min HR of 41 bpm, max HR of 160 bpm, and avg HR of 57 bpm. Predominant underlying rhythm was Sinus Rhythm. 92 Supraventricular Tachycardia runs occurred, the run with the fastest interval lasting 7 beats with a max rate of 160 bpm, the longest lasting 19.0 secs with an avg rate of 100 bpm. Isolated SVEs were rare (<1.0%), SVE Couplets were rare (<1.0%), and SVE Triplets  were rare (<1.0%). Isolated VEs were rare (<1.0%, 157), VE Couplets were rare (<1.0%, 9), and VE Triplets were rare (<1.0%, 2).  Summary:  The basic rhythm is normal sinus with an average HR of 57 bpm  Supraventricular and ventricular ectopics are rare (<1% burden)  There are short supraventricular runs present, without sustained SVT       ______________________________________________________________________________________________      EKG:   EKG Interpretation Date/Time:  Monday Jan 30 2024 15:34:44 EDT Ventricular Rate:  51 PR Interval:  218 QRS Duration:  86 QT Interval:  476 QTC Calculation: 438 R Axis:   3  Text Interpretation: Sinus bradycardia with 1st degree A-V block When compared with ECG of 24-Jan-2021 10:46, Premature supraventricular complexes are no longer Present Confirmed by Arnoldo Lapping [phone]) on 01/30/2024 3:41:54  PM    Recent Labs: 01/30/2024: ALT 24; BUN 24; Creatinine, Ser 1.18; Hemoglobin 13.1; Platelets 270; Potassium 4.5; Sodium 140  Recent Lipid Panel No results found for: "CHOL", "TRIG", "HDL", "CHOLHDL", "VLDL", "LDLCALC", "LDLDIRECT"   Risk Assessment/Calculations:    CHA2DS2-VASc Score = 5   This indicates a 7.2% annual risk of stroke. The patient's score is based upon: CHF History: 1 HTN History: 1 Diabetes History: 0 Stroke History: 0 Vascular Disease History: 0 Age Score: 2 Gender Score: 1           Physical Exam:    VS:  BP (!) 150/80   Pulse (!) 51   Ht 5\' 2"  (1.575 m)   Wt 206 lb 9.6 oz (93.7 kg)   SpO2 96%   BMI 37.79 kg/m     Wt Readings from Last 3 Encounters:  01/30/24 206 lb 9.6 oz (93.7 kg)  01/26/23 214 lb 9.6 oz (97.3 kg)  08/02/22 220 lb 6.4 oz (100 kg)     GEN:  Well nourished, well developed in no acute distress HEENT: Normal NECK: No JVD; No carotid bruits LYMPHATICS: No lymphadenopathy CARDIAC: RRR, 2/6 SEM at the RUSB RESPIRATORY:  Clear to auscultation without rales, wheezing or rhonchi  ABDOMEN: Soft, non-tender, non-distended MUSCULOSKELETAL:  No edema; No deformity  SKIN: Warm and dry NEUROLOGIC:  Alert and oriented x 3 PSYCHIATRIC:  Normal affect   Assessment & Plan Persistent atrial fibrillation (HCC) Tolerating oral amiodarone  for long-term rhythm control strategy and oral anticoagulation with apixaban .  Reports no bleeding problems.  We talked about various treatment strategies including switching her from amiodarone  to Tikosyn to reduce long-term potential drug toxicity, consideration of catheter ablation, or being off of antiarrhythmic drug therapy altogether.  The patient did have significant symptoms and deterioration of LV function with A-fib.  I think she is going to require either drug therapy or ablation to stay in sinus rhythm considering her advancing age and obesity.  She is only on amiodarone  100 mg 5 days/week and she  seems to be tolerating it quite well.  She has annual eye exams, regular thyroid  function test with stable thyroid  function, and serial labs.  I recommended continuing her current medical therapy.  I will check a CBC and complete metabolic panel today.  She continues to follow with endocrinology for her thyroid  function. HFimpEF (heart failure w improved EF) Patient had severe LV dysfunction associated with her atrial fibrillation with LVEF declined to 30 to 35%.  Follow-up echo has showed normalization of LV function with LVEF 65 to 70% and no regional wall motion abnormalities, normal RV function, and no significant valvular disease.  Continue current therapy no evidence of  volume overload on exam.  No heart failure symptoms. Essential hypertension Blood pressures under optimal control based on home readings.  Continue valsartan , hydralazine , spironolactone , and low-dose metoprolol .  No changes are made today.  Check metabolic panel.            Medication Adjustments/Labs and Tests Ordered: Current medicines are reviewed at length with the patient today.  Concerns regarding medicines are outlined above.  Orders Placed This Encounter  Procedures   CBC   Comprehensive metabolic panel with GFR   EKG [id number]   Meds ordered this encounter  Medications   metoprolol  tartrate (LOPRESSOR ) 25 MG tablet    Sig: Take 0.5 tablets (12.5 mg total) by mouth 2 (two) times daily.    Dispense:  45 tablet    Refill:  3    Dose DECREASE    Patient Instructions  Medication Instructions:  Continue Metoprolol  Tartrate 12.5mg  twice daily *If you need a refill on your cardiac medications before your next appointment, please call your pharmacy*  Lab Work: CBC, CMET today If you have labs (blood work) drawn today and your tests are completely normal, you will receive your results only by: MyChart Message (if you have MyChart) OR A paper copy in the mail If you have any lab test that is abnormal or we  need to change your treatment, we will call you to review the results.  Follow-Up: At [facility], you and your health needs are our priority.  As part of our continuing mission to provide you with exceptional heart care, our providers are all part of one team.  This team includes your primary Cardiologist (physician) and Advanced Practice Providers or APPs (Physician Assistants and Nurse Practitioners) who all work together to provide you with the care you need, when you need it.  Your next appointment:   1 year(s)  Provider:   Arnoldo Lapping, MD       Signed, Arnoldo Lapping, MD  02/05/2024 1:38 PM    [facility]

## 2024-01-30 NOTE — Assessment & Plan Note (Signed)
 Blood pressures under optimal control based on home readings.  Continue valsartan , hydralazine , spironolactone , and low-dose metoprolol .  No changes are made today.  Check metabolic panel.

## 2024-01-30 NOTE — Patient Instructions (Signed)
 Medication Instructions:  Continue Metoprolol  Tartrate 12.5mg  twice daily *If you need a refill on your cardiac medications before your next appointment, please call your pharmacy*  Lab Work: CBC, CMET today If you have labs (blood work) drawn today and your tests are completely normal, you will receive your results only by: MyChart Message (if you have MyChart) OR A paper copy in the mail If you have any lab test that is abnormal or we need to change your treatment, we will call you to review the results.  Follow-Up: At [facility], you and your health needs are our priority.  As part of our continuing mission to provide you with exceptional heart care, our providers are all part of one team.  This team includes your primary Cardiologist (physician) and Advanced Practice Providers or APPs (Physician Assistants and Nurse Practitioners) who all work together to provide you with the care you need, when you need it.  Your next appointment:   1 year(s)  Provider:   Arnoldo Lapping, MD

## 2024-01-31 LAB — COMPREHENSIVE METABOLIC PANEL WITH GFR
ALT: 24 IU/L (ref 0–32)
AST: 17 IU/L (ref 0–40)
Albumin: 4.3 g/dL (ref 3.8–4.8)
Alkaline Phosphatase: 73 IU/L (ref 44–121)
BUN/Creatinine Ratio: 20 (ref 12–28)
BUN: 24 mg/dL (ref 8–27)
Bilirubin Total: 0.2 mg/dL (ref 0.0–1.2)
CO2: 20 mmol/L (ref 20–29)
Calcium: 9.4 mg/dL (ref 8.7–10.3)
Chloride: 105 mmol/L (ref 96–106)
Creatinine, Ser: 1.18 mg/dL — ABNORMAL HIGH (ref 0.57–1.00)
Globulin, Total: 2.8 g/dL (ref 1.5–4.5)
Glucose: 87 mg/dL (ref 70–99)
Potassium: 4.5 mmol/L (ref 3.5–5.2)
Sodium: 140 mmol/L (ref 134–144)
Total Protein: 7.1 g/dL (ref 6.0–8.5)
eGFR: 48 mL/min/{1.73_m2} — ABNORMAL LOW (ref >59)

## 2024-01-31 LAB — CBC
Hematocrit: 40.1 % (ref 34.0–46.6)
Hemoglobin: 13.1 g/dL (ref 11.1–15.9)
MCH: 29.4 pg (ref 26.6–33.0)
MCHC: 32.7 g/dL (ref 31.5–35.7)
MCV: 90 fL (ref 79–97)
Platelets: 270 10*3/uL (ref 150–450)
RBC: 4.46 x10E6/uL (ref 3.77–5.28)
RDW: 12.7 % (ref 11.7–15.4)
WBC: 6 10*3/uL (ref 3.4–10.8)

## 2024-02-03 ENCOUNTER — Telehealth: Payer: Self-pay | Admitting: Cardiovascular Disease

## 2024-02-03 NOTE — Telephone Encounter (Signed)
 The patient has been notified of the result and verbalized understanding.  All questions (if any) were answered. Peggi Bowels, LPN 10/02/1094 0:45 PM

## 2024-02-03 NOTE — Telephone Encounter (Signed)
 Pt returning call for lab results

## 2024-02-03 NOTE — Telephone Encounter (Signed)
-----   Message from Abagail Hoar sent at 02/03/2024 10:14 AM EDT ----- Labs are completley stable. No changes.

## 2024-03-02 ENCOUNTER — Emergency Department (HOSPITAL_BASED_OUTPATIENT_CLINIC_OR_DEPARTMENT_OTHER): Admitting: Radiology

## 2024-03-02 ENCOUNTER — Emergency Department (HOSPITAL_BASED_OUTPATIENT_CLINIC_OR_DEPARTMENT_OTHER)
Admission: EM | Admit: 2024-03-02 | Discharge: 2024-03-02 | Disposition: A | Discharge status: Home / Self Care | Attending: Emergency Medicine | Admitting: Emergency Medicine

## 2024-03-02 ENCOUNTER — Encounter (HOSPITAL_BASED_OUTPATIENT_CLINIC_OR_DEPARTMENT_OTHER): Payer: Self-pay

## 2024-03-02 ENCOUNTER — Telehealth: Payer: Self-pay | Admitting: Cardiovascular Disease

## 2024-03-02 ENCOUNTER — Other Ambulatory Visit: Payer: Self-pay

## 2024-03-02 DIAGNOSIS — I5032 Chronic diastolic (congestive) heart failure: Secondary | ICD-10-CM

## 2024-03-02 DIAGNOSIS — I4891 Unspecified atrial fibrillation: Secondary | ICD-10-CM | POA: Diagnosis not present

## 2024-03-02 DIAGNOSIS — I4819 Other persistent atrial fibrillation: Secondary | ICD-10-CM

## 2024-03-02 DIAGNOSIS — I480 Paroxysmal atrial fibrillation: Secondary | ICD-10-CM

## 2024-03-02 DIAGNOSIS — Z7901 Long term (current) use of anticoagulants: Secondary | ICD-10-CM

## 2024-03-02 DIAGNOSIS — Z79899 Other long term (current) drug therapy: Secondary | ICD-10-CM

## 2024-03-02 DIAGNOSIS — R002 Palpitations: Secondary | ICD-10-CM | POA: Diagnosis present

## 2024-03-02 DIAGNOSIS — I10 Essential (primary) hypertension: Secondary | ICD-10-CM | POA: Diagnosis not present

## 2024-03-02 LAB — CBC
HCT: 39.3 % (ref 36.0–46.0)
Hemoglobin: 12.7 g/dL (ref 12.0–15.0)
MCH: 29.3 pg (ref 26.0–34.0)
MCHC: 32.3 g/dL (ref 30.0–36.0)
MCV: 90.8 fL (ref 80.0–100.0)
Platelets: 298 10*3/uL (ref 150–400)
RBC: 4.33 MIL/uL (ref 3.87–5.11)
RDW: 13.7 % (ref 11.5–15.5)
WBC: 6.5 10*3/uL (ref 4.0–10.5)
nRBC: 0 % (ref 0.0–0.2)

## 2024-03-02 LAB — BASIC METABOLIC PANEL WITH GFR
Anion gap: 14 (ref 5–15)
BUN: 22 mg/dL (ref 8–23)
CO2: 21 mmol/L — ABNORMAL LOW (ref 22–32)
Calcium: 9.8 mg/dL (ref 8.9–10.3)
Chloride: 102 mmol/L (ref 98–111)
Creatinine, Ser: 1.24 mg/dL — ABNORMAL HIGH (ref 0.44–1.00)
GFR, Estimated: 45 mL/min — ABNORMAL LOW (ref >60)
Glucose, Bld: 98 mg/dL (ref 70–99)
Potassium: 4 mmol/L (ref 3.5–5.1)
Sodium: 137 mmol/L (ref 135–145)

## 2024-03-02 LAB — TROPONIN T, HIGH SENSITIVITY: Troponin T High Sensitivity: <15 ng/L (ref <19)

## 2024-03-02 MED ORDER — PROPOFOL 10 MG/ML IV BOLUS
0.5000 mg/kg | Freq: Once | INTRAVENOUS | Status: AC
  Administered 2024-03-02: 46.5 mg via INTRAVENOUS
  Filled 2024-03-02: qty 20

## 2024-03-02 NOTE — ED Notes (Signed)
 RT titrated ETCO2 [place] from 4 lpm to 2 Lpm. Pt respiratory status stable at this time w/no distress noted.    03/02/24 0432  Therapy Vitals  Pulse Rate 66  Resp 16  BP 123/67  MEWS Score/Color  MEWS Score 0  MEWS Score Color Green  Respiratory Assessment  Assessment Type Assess only  Respiratory Pattern Regular;Unlabored;Symmetrical  Chest Assessment Chest expansion symmetrical  Cough None  Oxygen Therapy/Pulse Ox  O2 Device ETCO2 Nasal Cannula  O2 Therapy Oxygen  O2 Flow Rate (L/min) (S)  2 L/min  SpO2 100 %  ETCO2 (mmHg) 35 mmHg

## 2024-03-02 NOTE — Discharge Instructions (Signed)
 Continue medications as previously prescribed.  Follow-up with your primary doctor/cardiologist, and return to the ER if symptoms worsen or change.

## 2024-03-02 NOTE — ED Triage Notes (Signed)
 Pt presents via POV c/o "heart out of rhythm". Reports hx of the same requiring cardioversion. Reports heart rate in 120s at home. Pt reports taking Eliquis .   A&O x4. Ambulatory to triage using cane.

## 2024-03-02 NOTE — ED Notes (Signed)
 Pt's husband is in the lobby. He has been updated that pt did well during the cardioversion. States he prefers to sit in the lobby until she is d/c home.

## 2024-03-02 NOTE — Sedation Documentation (Signed)
 Additional 40mg  propofol  given

## 2024-03-02 NOTE — ED Notes (Signed)
 Pt A&Ox4, NAD noted, ambulatory at discharge.

## 2024-03-02 NOTE — Sedation Documentation (Addendum)
 Sync cardioversion at 120J.  Pt. converted to SR.

## 2024-03-02 NOTE — ED Provider Notes (Signed)
 [place] EMERGENCY DEPARTMENT AT [facility] Provider Note   CSN: [id number] Arrival date & time: 03/02/24  0231     History  Chief Complaint  Patient presents with   Palpitations    Lisaanne Lawrie is a 78 y.o. female.  This patient is a 78 year old female with past medical history of atrial fibrillation on Eliquis , hypertension, hyperlipidemia.  Patient presenting today with complaints of irregular heartbeat.  She reports feeling poorly throughout the day today, then noticed this evening that her heart beat was irregular.  She denies any chest pain or difficulty breathing.  No fevers or chills.  She has had atrial fibrillation in the past and has been cardioverted on 2 separate occasions.       Home Medications Prior to Admission medications   Medication Sig Start Date End Date Taking? Authorizing Provider  acetaminophen (TYLENOL) 650 MG CR tablet Take 650 mg by mouth every 8 (eight) hours as needed (for pain).    [provider]  amiodarone  (PACERONE ) 100 MG tablet TAKE 1 TABLET BY MOUTH ONCE DAILY FIVE DAYS PER WEEK (SKIP TUESDAYS AND SATURDAYS, TAKE ALL OTHER DAYS) 10/25/23   Arnoldo Lapping, MD  ELIQUIS  5 MG TABS tablet TAKE 1 TABLET BY MOUTH TWICE A DAY 01/30/24   Arnoldo Lapping, MD  glucosamine-chondroitin 500-400 MG tablet Take 1 tablet by mouth daily. Per patient taking 2 tablets daily    [provider]  hydrALAZINE  (APRESOLINE ) 25 MG tablet TAKE 2 TABLETS (50 MG TOTAL) BY MOUTH 3 (THREE) TIMES DAILY. 12/22/23   Cooper, Michael, MD  loratadine (CLARITIN) 10 MG tablet Take 10 mg by mouth daily as needed for allergies.    [provider]  metoprolol  tartrate (LOPRESSOR ) 25 MG tablet Take 0.5 tablets (12.5 mg total) by mouth 2 (two) times daily. 01/30/24   Arnoldo Lapping, MD  OVER THE COUNTER MEDICATION Calcium 600 mg plus Vit. D 20 mcg taking 1 tablet twice a day    [provider]  spironolactone  (ALDACTONE ) 25 MG tablet TAKE 1  TABLET (25 MG TOTAL) BY MOUTH DAILY. 09/07/23   Cooper, Michael, MD  SYNTHROID 125 MCG tablet Take 125 mcg by mouth daily before breakfast. Taking 2 tablets on Sunday.    [provider]  valsartan  (DIOVAN ) 80 MG tablet Take 1 tablet (80 mg total) by mouth 2 (two) times daily. KEEP OV. 01/24/24   Arnoldo Lapping, MD      Allergies    Amlodipine , Penicillins, and Sulfamethoxazole    Review of Systems   Review of Systems  All other systems reviewed and are negative.   Physical Exam Updated Vital Signs BP (!) 153/89   Pulse (!) 104   Temp 98.4 F (36.9 C) (Oral)   Resp 18   Ht 5\' 2"  (1.575 m)   Wt 93 kg   SpO2 98%   BMI 37.50 kg/m  Physical Exam Vitals and nursing note reviewed.  Constitutional:      General: She is not in acute distress.    Appearance: She is well-developed. She is not diaphoretic.  HENT:     Head: Normocephalic and atraumatic.  Cardiovascular:     Rate and Rhythm: Tachycardia present. Rhythm irregular.     Heart sounds: No murmur heard.    No friction rub. No gallop.  Pulmonary:     Effort: Pulmonary effort is normal. No respiratory distress.     Breath sounds: Normal breath sounds. No wheezing.  Abdominal:     General: Bowel sounds  are normal. There is no distension.     Palpations: Abdomen is soft.     Tenderness: There is no abdominal tenderness.  Musculoskeletal:        General: Normal range of motion.     Cervical back: Normal range of motion and neck supple.  Skin:    General: Skin is warm and dry.  Neurological:     General: No focal deficit present.     Mental Status: She is alert and oriented to person, place, and time.     ED Results / Procedures / Treatments   Labs (all labs ordered are listed, but only abnormal results are displayed) Labs Reviewed  BASIC METABOLIC PANEL WITH GFR - Abnormal; Notable for the following components:      Result Value   CO2 21 (*)    Creatinine, Ser 1.24 (*)    GFR, Estimated 45 (*)    All  other components within normal limits  CBC  TROPONIN T, HIGH SENSITIVITY    EKG EKG Interpretation Date/Time:  Friday March 02 2024 02:38:36 EDT Ventricular Rate:  105 PR Interval:    QRS Duration:  96 QT Interval:  370 QTC Calculation: 489 R Axis:   -26  Text Interpretation: Atrial fibrillation Inferior infarct, old Anterior infarct, old Confirmed by Orvilla Blander ([id number]) on 03/02/2024 2:42:32 AM  Radiology DG Chest 2 View Result Date: 03/02/2024 EXAM: 2 VIEW(S) XRAY OF THE CHEST 03/02/2024 03:00:53 AM COMPARISON: 02/01/2023 CLINICAL HISTORY: Palpitations. Table formatting from the original note was not included. Images from the original note were not included. Triage note: Pt presents via POV c/o "heart out of rhythm". Reports hx of the same requiring cardioversion. Reports heart rate in 120s at home. Pt reports taking Eliquis . A\T\O x4. Ambulatory to triage using cane. FINDINGS: LUNGS AND PLEURA: No focal pulmonary opacity. No pulmonary edema. No pleural effusion. No pneumothorax. HEART AND MEDIASTINUM: No acute abnormality of the cardiac and mediastinal silhouettes. BONES AND SOFT TISSUES: Mild degenerative changes of the mid/lower cervical spine. No acute osseous abnormality. IMPRESSION: 1. No acute process. Electronically signed by: Zadie Herter MD 03/02/2024 03:07 AM EDT RP Workstation: [id number]    Procedures .Cardioversion  Date/Time: 03/02/2024 4:53 AM  Performed by: Orvilla Blander, MD Authorized by: Orvilla Blander, MD   Consent:    Consent obtained:  Verbal and written   Consent given by:  Patient   Risks discussed:  Cutaneous burn and induced arrhythmia   Alternatives discussed:  No treatment and rate-control medication Pre-procedure details:    Cardioversion basis:  Elective   Rhythm:  Atrial fibrillation   Electrode placement:  Anterior-posterior Patient sedated: Yes. Refer to sedation procedure documentation for details of sedation.  Attempt one:    Cardioversion  mode:  Synchronous   Waveform:  Monophasic   Shock (Joules):  120   Shock outcome:  Conversion to normal sinus rhythm Post-procedure details:    Patient status:  Alert   Patient tolerance of procedure:  Tolerated well, no immediate complications .Sedation  Date/Time: 03/02/2024 4:53 AM  Performed by: Orvilla Blander, MD Authorized by: Orvilla Blander, MD   Consent:    Consent obtained:  Verbal   Consent given by:  Patient   Risks discussed:  Allergic reaction, dysrhythmia, inadequate sedation, nausea, prolonged hypoxia resulting in organ damage, prolonged sedation necessitating reversal, respiratory compromise necessitating ventilatory assistance and intubation and vomiting   Alternatives discussed:  Analgesia without sedation, anxiolysis and regional anesthesia Universal protocol:    Procedure explained and questions  answered to patient or proxy's satisfaction: yes     Relevant documents present and verified: yes     Test results available: yes     Imaging studies available: yes     Required blood products, implants, devices, and special equipment available: yes     Site/side marked: yes     Immediately prior to procedure, a time out was called: yes     Patient identity confirmed:  Verbally with patient and arm band Indications:    Procedure performed:  Cardioversion   Procedure necessitating sedation performed by:  Physician performing sedation Pre-sedation assessment:    Time since last food or drink:  6 hours   ASA classification: class 1 - normal, healthy patient     Mouth opening:  3 or more finger widths   Thyromental distance:  4 finger widths   Mallampati score:  I - soft palate, uvula, fauces, pillars visible   Neck mobility: normal     Pre-sedation assessments completed and reviewed: pre-procedure airway patency not reviewed, pre-procedure cardiovascular function not reviewed, pre-procedure hydration status not reviewed, pre-procedure mental status not reviewed,  pre-procedure nausea and vomiting status not reviewed, pre-procedure pain level not reviewed, pre-procedure respiratory function not reviewed and pre-procedure temperature not reviewed   A pre-sedation assessment was completed prior to the start of the procedure Immediate pre-procedure details:    Reassessment: Patient reassessed immediately prior to procedure     Reviewed: vital signs, relevant labs/tests and NPO status     Verified: bag valve mask available, emergency equipment available, intubation equipment available, IV patency confirmed, oxygen available and suction available   Procedure details (see MAR for exact dosages):    Preoxygenation:  Nasal cannula   Sedation:  Propofol    Intended level of sedation: deep   Intra-procedure monitoring:  Blood pressure monitoring, cardiac monitor, continuous pulse oximetry, frequent LOC assessments, frequent vital sign checks and continuous capnometry   Intra-procedure events: none     Intra-procedure management:  Supplemental oxygen   Total Provider sedation time (minutes):  10 Post-procedure details:   A post-sedation assessment was completed following the completion of the procedure.   Attendance: Constant attendance by certified staff until patient recovered     Recovery: Patient returned to pre-procedure baseline     Post-sedation assessments completed and reviewed: airway patency, cardiovascular function, hydration status, mental status, nausea/vomiting, pain level, respiratory function and temperature     Patient is stable for discharge or admission: yes     Procedure completion:  Tolerated well, no immediate complications     Medications Ordered in ED Medications  propofol  (DIPRIVAN ) 10 mg/mL bolus/IV push 46.5 mg (has no administration in time range)    ED Course/ Medical Decision Making/ A&P  Patient is a 78 year old female with history of paroxysmal atrial fibrillation on Eliquis .  Patient presenting with palpitations and  irregular heartbeat.  She arrives here in atrial fibrillation, but with stable vital signs otherwise.  Physical exam unremarkable.  Laboratory studies obtained including CBC, basic metabolic panel, and troponin, all of which are unremarkable.  Chest x-ray showing no acute process.  Conscious sedation performed using propofol  and patient was successfully cardioverted back to a normal sinus rhythm.  She tolerated the procedure well with no complications and has maintained a sinus rhythm since.  She will be discharged with continuing of her home medications.  CRITICAL CARE Performed by: Orvilla Blander Total critical care time: 40 minutes Critical care time was exclusive of separately billable procedures and treating other  patients. Critical care was necessary to treat or prevent imminent or life-threatening deterioration. Critical care was time spent personally by me on the following activities: development of treatment plan with patient and/or surrogate as well as nursing, discussions with consultants, evaluation of patient's response to treatment, examination of patient, obtaining history from patient or surrogate, ordering and performing treatments and interventions, ordering and review of laboratory studies, ordering and review of radiographic studies, pulse oximetry and re-evaluation of patient's condition.   Final Clinical Impression(s) / ED Diagnoses Final diagnoses:  None    Rx / DC Orders ED Discharge Orders     None         Orvilla Blander, MD 03/02/24 [phone]

## 2024-03-02 NOTE — ED Notes (Signed)
 RT titrated pts ETCO2 [place] from 2 Lpm to RA at this time. Pt to remain on ETCO2 for monitoring purposes. Pt BL/BS clr/clr dim w/no distress noted at this time.   03/02/24 0452  Therapy Vitals  Pulse Rate 63  Resp 14  Patient Position (if appropriate) Lying  MEWS Score/Color  MEWS Score 0  MEWS Score Color Green  Respiratory Assessment  Assessment Type Assess only  Respiratory Pattern Regular;Unlabored;Symmetrical  Chest Assessment Chest expansion symmetrical  Cough None  Bilateral Breath Sounds Clear;Diminished  R Upper  Breath Sounds Clear  L Upper Breath Sounds Clear  R Lower Breath Sounds Clear;Diminished  L Lower Breath Sounds Clear;Diminished  Oxygen Therapy/Pulse Ox  O2 Device ETCO2 Nasal Cannula  O2 Therapy Room air  O2 Flow Rate (L/min) (S)  0 L/min  SpO2 98 %  ETCO2 (mmHg) 33 mmHg

## 2024-03-02 NOTE — Sedation Documentation (Signed)
Unable to rate pain due to procedure.  

## 2024-03-02 NOTE — ED Notes (Signed)
 RT assessed pt prior to conscious/procedural sedation. Pt placed on ETCO2 [place] 4 lpm, BVM setup and available, airway cart prepped and ready. Pt respiratory status stable on [place] w/no distress noted at this time. Pt BLBS clr/dim.    03/02/24 0345  Respiratory Assessment  Assessment Type Assess only  Respiratory Pattern Regular;Unlabored;Symmetrical  Chest Assessment Chest expansion symmetrical  Cough None  Bilateral Breath Sounds Clear;Diminished  Oxygen Therapy/Pulse Ox  O2 Device ETCO2 Nasal Cannula  O2 Therapy Oxygen  O2 Flow Rate (L/min) 4 L/min  SpO2 100 %  Safety Instructions Yes (Comment)  ETCO2 (mmHg) 34 mmHg

## 2024-03-02 NOTE — Telephone Encounter (Signed)
 Pt called in asking if Dr. Arlester Ladd can review her ED notes from today and give any recommendations or anything she needs to do moving forward.

## 2024-03-04 NOTE — Telephone Encounter (Signed)
 Reviewed ER notes. Pt with symptomatic AFib on low-dose amiodarone , cardioverted in ER. Would increase amiodarone  to 100 mg every day and please refer to EP to evaluate for AFib ablation. Thanks

## 2024-03-05 MED ORDER — AMIODARONE HCL 100 MG PO TABS: 100.0000 mg | ORAL_TABLET | Freq: Every day | ORAL | Status: DC

## 2024-03-05 NOTE — Telephone Encounter (Signed)
 Pt requesting a c/b from the nurse in regards to what she should do after her hospital visit. Please advise

## 2024-03-05 NOTE — Telephone Encounter (Signed)
 Patient states her heart went back out of rhythm over the weekend.  This morning BP 116/68, HR 98. Patient reports over the weekend HR was 100-144, today has stabilized some to 90-110.  Shared response from Dr. Arlester Ladd: Reviewed ER notes. Pt with symptomatic AFib on low-dose amiodarone , cardioverted in ER. Would increase amiodarone  to 100 mg every day and please refer to EP to evaluate for AFib ablation     Also advised patient she may taken an extra half tablet of Lopressor  this afternoon as long as BP is >110/60, HR >60. EP referral placed, a scheduler will call to schedule appt. Patient verbalized understanding of the above.  Med list updated with Amiodarone  dose change.

## 2024-03-06 NOTE — Telephone Encounter (Signed)
 Pt has further concerns post ED visit

## 2024-03-06 NOTE — Telephone Encounter (Signed)
 Patient is following up. She reports BP today is 109/83. She worries that if she takes her BP medication it will drop even lower.

## 2024-03-08 MED ORDER — HYDRALAZINE HCL 25 MG PO TABS: 25.0000 mg | ORAL_TABLET | Freq: Three times a day (TID) | ORAL | Status: DC

## 2024-03-08 NOTE — Telephone Encounter (Signed)
 With her blood pressure running low, maybe we should reduce her hydralazine  dose.  It looks like she is currently taking 2 hydralazine  tablets 3 times per day.  Lets have her reduce the dose to 1 tablet 3 times a day.  Thanks

## 2024-03-08 NOTE — Addendum Note (Signed)
 Addended by: Render Carrie on: 03/08/2024 12:24 PM   Modules accepted: Orders

## 2024-03-08 NOTE — Telephone Encounter (Signed)
 Spoke with pt, aware of dr cooper's recommendations.

## 2024-03-12 NOTE — Progress Notes (Signed)
 Electrophysiology Office Note:   Date:  03/14/2024  ID:  Michelle Watkins, DOB 10-21-1945, MRN [id number]  Primary Cardiologist: Ozell Fell, MD Electrophysiologist: [name], MD      History of Present Illness:   Michelle Watkins is a 78 y.o. female with h/o persistent atrial fibrillation, HFimpEF presumptively tachy/arrhythmia mediated, HTN who is being seen today for evaluation for catheter ablation at the request of Dr. Fell.   Discussed the use of AI scribe software for clinical note transcription with the patient, who gave verbal consent to proceed.  History of Present Illness Michelle Watkins is a 78 year old female with atrial fibrillation who presents for consultation regarding management of her condition. She was referred by Dr. Fell for evaluation of her atrial fibrillation.  She has experienced atrial fibrillation for three to four years, undergoing three cardioversions, with the most recent on March 02, 2024. Symptoms recurred two days later, characterized by a high and irregular heart rate, leading to adjustments in her medication regimen. Despite these changes, her heart rate remains elevated. Her current medications include metoprolol , adjusted to half a pill three times a day. Hydralazine  was reduced to one tablet three times a day to accommodate this change. She has been on amiodarone  since her diagnosis, initially at a small dose, and after her recent episode, her dosing was changed from five days a week to daily dosing at 100 mg per day. During episodes of atrial fibrillation, she feels worse. She is tolerating Eliquis . 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 She is concerned about the risk of falling due to her osteoporosis.  Review of systems complete and found to be negative unless listed in HPI.   EP Information / Studies Reviewed:    EKG is not ordered today. EKG from  03/02/24 reviewed which showed AF with RVR.      EKG 03/02/24:    Echo 02/04/21:   1. Left ventricular ejection fraction by 3D volume is 69 %. The left  ventricle has no regional wall motion abnormalities.   2. Right ventricular systolic function is normal. The right ventricular  size is normal. There is normal pulmonary artery systolic pressure.   3. Left atrial size was mildly dilated.   4. The mitral valve is normal in structure. Trivial mitral valve  regurgitation. No evidence of mitral stenosis.   5. The aortic valve is normal in structure. Aortic valve regurgitation is  not visualized. No aortic stenosis is present.   6. The inferior vena cava is normal in size with greater than 50%  respiratory variability, suggesting right atrial pressure of 3 mmHg.   Risk Assessment/Calculations:    CHA2DS2-VASc Score = 5   This indicates a 7.2% annual risk of stroke. The patient's score is based upon: CHF History: 1 HTN History: 1 Diabetes History: 0 Stroke History: 0 Vascular Disease History: 0 Age Score: 2 Gender Score: 1             Physical Exam:   VS:  BP 110/62   Pulse (!) 112   Ht 5' 2 (1.575 m)   SpO2 96%   BMI 37.50 kg/m    Wt Readings from Last 3 Encounters:  03/02/24 205 lb 0.4 oz (93 kg)  01/30/24 206 lb 9.6 oz (93.7 kg)  01/26/23 214 lb 9.6 oz (97.3 kg)     GEN: Well nourished, well developed in no acute distress  NECK: No JVD CARDIAC: Tachycardic, irregular RESPIRATORY:  Clear to auscultation without rales, wheezing or rhonchi  ABDOMEN: Soft, non-distended EXTREMITIES:  No edema; No deformity   ASSESSMENT AND PLAN:    #. Persistent atrial fibrillation, symptomatic: Associated with decline in LVEF. For this reason, rhythm control strategy has been chosen.  -Discussed treatment options today for AF including antiarrhythmic drug therapy and ablation. Discussed risks, recovery and likelihood of success with each treatment strategy. Risk, benefits, and  alternatives to EP study and ablation for afib were discussed. These risks include but are not limited to stroke, bleeding, vascular damage, tamponade, perforation, damage to the esophagus, lungs, phrenic nerve and other structures, pulmonary vein stenosis, worsening renal function, coronary vasospasm and death.  Discussed potential need for repeat ablation procedures and antiarrhythmic drugs after an initial ablation. The patient understands these risk and wishes to proceed.  We will therefore proceed with catheter ablation at the next available time.  Carto, ICE, anesthesia are requested for the procedure.  Will also obtain CT PV protocol prior to the procedure to exclude LAA thrombus and further evaluate atrial anatomy. -Continue amiodarone  100mg  daily.  -Continue metoprolol  12.5mg  TID.   #. Secondary hypercoagulable state due to AF:  -Continue Eliquis  5mg  BID.  #. HFimpEF: Presumptively tachy/arrhythmia induced. Well compensated today. - Rhythm control strategy as above.   #. Hypertension -At goal today.  Recommend checking blood pressures 1-2 times per week at home and recording the values.  Recommend bringing these recordings to the primary care physician.   Follow up with Dr. Kennyth 3 months after ablation.   Signed, Fonda Kennyth, MD

## 2024-03-13 ENCOUNTER — Other Ambulatory Visit: Payer: Self-pay

## 2024-03-13 ENCOUNTER — Ambulatory Visit: Attending: Cardiology | Admitting: Cardiology

## 2024-03-13 ENCOUNTER — Encounter: Payer: Self-pay | Admitting: Cardiology

## 2024-03-13 VITALS — BP 110/62 | HR 112 | Ht 62.0 in

## 2024-03-13 DIAGNOSIS — I4819 Other persistent atrial fibrillation: Secondary | ICD-10-CM

## 2024-03-13 DIAGNOSIS — I5032 Chronic diastolic (congestive) heart failure: Secondary | ICD-10-CM

## 2024-03-13 DIAGNOSIS — D6869 Other thrombophilia: Secondary | ICD-10-CM | POA: Diagnosis not present

## 2024-03-13 DIAGNOSIS — I10 Essential (primary) hypertension: Secondary | ICD-10-CM | POA: Diagnosis not present

## 2024-03-13 NOTE — Patient Instructions (Addendum)
 Medication Instructions:  Your physician recommends that you continue on your current medications as directed. Please refer to the Current Medication list given to you today.  *If you need a refill on your cardiac medications before your next appointment, please call your pharmacy*  Lab Work: BMET and CBC - you may go to any [facility] location to have these drawn the week of August 11th  Testing/Procedures: Echocardiogram  Your physician has requested that you have an echocardiogram. Echocardiography is a painless test that uses sound waves to create images of your heart. It provides your doctor with information about the size and shape of your heart and how well your heart's chambers and valves are working. This procedure takes approximately one hour. There are no restrictions for this procedure. Please do NOT wear cologne, perfume, aftershave, or lotions (deodorant is allowed). Please arrive 15 minutes prior to your appointment time.  Cardiac CT Your physician has requested that you have cardiac CT. Cardiac computed tomography (CT) is a painless test that uses an x-ray machine to take clear, detailed pictures of your heart. For further information please visit [email or web address]  We will call you to schedule your CT scan. It will be done about three weeks prior to your ablation.  Ablation Your physician has recommended that you have an ablation. Catheter ablation is a medical procedure used to treat some cardiac arrhythmias (irregular heartbeats). During catheter ablation, a long, thin, flexible tube is put into a blood vessel in your groin (upper thigh), or neck. This tube is called an ablation catheter. It is then guided to your heart through the blood vessel. Radio frequency waves destroy small areas of heart tissue where abnormal heartbeats may cause an arrhythmia to start.  You are scheduled for Atrial Fibrillation Ablation on Thursday, September 4 with Dr. Clinton Danas.Please arrive at the  Main Entrance A at [facility]: [address] [place], [place] [address] at 5:30 AM    Follow-Up: At [facility], you and your health needs are our priority.  As part of our continuing mission to provide you with exceptional heart care, we have created designated Provider Care Teams.  These Care Teams include your primary Cardiologist (physician) and Advanced Practice Providers (APPs -  Physician Assistants and Nurse Practitioners) who all work together to provide you with the care you need, when you need it.   Your next appointment:   We will contact you about your post-procedure follow up appointments.

## 2024-03-19 ENCOUNTER — Telehealth: Payer: Self-pay | Admitting: Cardiovascular Disease

## 2024-03-19 DIAGNOSIS — I10 Essential (primary) hypertension: Secondary | ICD-10-CM

## 2024-03-19 DIAGNOSIS — I5020 Unspecified systolic (congestive) heart failure: Secondary | ICD-10-CM

## 2024-03-19 MED ORDER — SPIRONOLACTONE 25 MG PO TABS: 25.0000 mg | ORAL_TABLET | Freq: Every day | ORAL | 3 refills | Status: AC

## 2024-03-19 NOTE — Telephone Encounter (Signed)
 RX sent to requested Pharmacy

## 2024-03-19 NOTE — Telephone Encounter (Signed)
*  STAT* If patient is at the pharmacy, call can be transferred to refill team.   1. Which medications need to be refilled? (please list name of each medication and dose if known)   spironolactone  (ALDACTONE ) 25 MG tablet    2. Which pharmacy/location (including street and city if local pharmacy) is medication to be sent to?  [id number] - [place], [place] - [address]      3. Do they need a 30 day or 90 day supply? 90 day    Pt is out of medication

## 2024-04-11 ENCOUNTER — Telehealth: Payer: Self-pay | Admitting: Cardiovascular Disease

## 2024-04-11 NOTE — Telephone Encounter (Signed)
 Call to patient to discuss concerns about low BP.   Dr. Wonda (gen cards) recently reduced her dose of hydralazine  on 6/12 due to Bp trending low, though it does not appear she was having symptoms at that time.  She was seen by Dr. Kennyth recently s/p cardioversion and her BP at that time was 110/62.   Patient reports she noticed her BP was trending down recently and she started taking only 2 doses of hydralazine  instead of 3.   She reports on Monday 04/09/24, reading was 105/76, HR 96. She denies any symptoms that day.  She reports that today her BP was 89/57, HR 100 at 12:30. She states she sat down, drank some water and ate some chips, and episode passed after 30 mins. She checked her BP again at 1:30 pm and BP was 98/73, HR 65. She denies lightheadedness at the time of my call.   Spoke with DOD Dr. Ladona who advised patient stop hydralazine  at this time and continue to monitor BP, calling us  back if BP does not improve. Patient verbalizes understanding. Also advised patient to get adequate intake and to call 911 if lightheadness gets worse of if she experiences syncope.

## 2024-04-11 NOTE — Telephone Encounter (Signed)
 Pt c/o BP issue: STAT if pt c/o blurred vision, one-sided weakness or slurred speech.  STAT if BP is GREATER than 180/120 TODAY.  STAT if BP is LESS than 90/60 and SYMPTOMATIC TODAY  1. What is your BP concern?   Low BP readings  2. Have you taken any BP medication today?  Yes  3. What are your last 5 BP readings?  Around 12:30 pm today 89/57  HR 74 98/73  HR 65 earlier this morning   4. Are you having any other symptoms (ex. Dizziness, headache, blurred vision, passed out)?  Lightheaded   Patient stated she thinks she is taking too much BP medication in the morning as her BP falls too low.

## 2024-04-14 ENCOUNTER — Other Ambulatory Visit: Payer: Self-pay | Admitting: Cardiovascular Disease

## 2024-04-14 DIAGNOSIS — I5032 Chronic diastolic (congestive) heart failure: Secondary | ICD-10-CM

## 2024-04-14 DIAGNOSIS — Z79899 Other long term (current) drug therapy: Secondary | ICD-10-CM

## 2024-04-14 DIAGNOSIS — Z7901 Long term (current) use of anticoagulants: Secondary | ICD-10-CM

## 2024-04-14 DIAGNOSIS — I4819 Other persistent atrial fibrillation: Secondary | ICD-10-CM

## 2024-04-16 NOTE — Telephone Encounter (Signed)
 Agree. thanks

## 2024-04-24 ENCOUNTER — Other Ambulatory Visit: Payer: Self-pay | Admitting: Cardiovascular Disease

## 2024-04-25 ENCOUNTER — Ambulatory Visit (HOSPITAL_COMMUNITY)
Admission: RE | Admit: 2024-04-25 | Discharge: 2024-04-25 | Disposition: A | Discharge status: Home / Self Care | Source: Ambulatory Visit | Attending: Cardiovascular Disease | Admitting: Cardiovascular Disease

## 2024-04-25 DIAGNOSIS — I4819 Other persistent atrial fibrillation: Secondary | ICD-10-CM | POA: Diagnosis present

## 2024-04-25 LAB — ECHOCARDIOGRAM COMPLETE
MV M vel: 5.01 m/s
MV Peak grad: 100.4 mmHg
S' Lateral: 2.9 cm

## 2024-05-04 ENCOUNTER — Ambulatory Visit: Payer: Self-pay | Admitting: Cardiology

## 2024-05-10 ENCOUNTER — Ambulatory Visit (HOSPITAL_COMMUNITY)
Admission: RE | Admit: 2024-05-10 | Discharge: 2024-05-10 | Disposition: A | Discharge status: Home / Self Care | Source: Ambulatory Visit | Attending: Cardiology | Admitting: Cardiology

## 2024-05-10 DIAGNOSIS — I4819 Other persistent atrial fibrillation: Secondary | ICD-10-CM | POA: Diagnosis present

## 2024-05-10 MED ORDER — IOHEXOL 350 MG/ML SOLN
100.0000 mL | Freq: Once | INTRAVENOUS | Status: AC | PRN
  Administered 2024-05-10: 80 mL via INTRAVENOUS

## 2024-05-11 LAB — BASIC METABOLIC PANEL WITH GFR
BUN/Creatinine Ratio: 16 (ref 12–28)
BUN: 19 mg/dL (ref 8–27)
CO2: 20 mmol/L (ref 20–29)
Calcium: 9.2 mg/dL (ref 8.7–10.3)
Chloride: 101 mmol/L (ref 96–106)
Creatinine, Ser: 1.17 mg/dL — ABNORMAL HIGH (ref 0.57–1.00)
Glucose: 79 mg/dL (ref 70–99)
Potassium: 4.6 mmol/L (ref 3.5–5.2)
Sodium: 137 mmol/L (ref 134–144)
eGFR: 48 mL/min/1.73 — ABNORMAL LOW (ref >59)

## 2024-05-11 LAB — CBC
Hematocrit: 40.5 % (ref 34.0–46.6)
Hemoglobin: 13.1 g/dL (ref 11.1–15.9)
MCH: 29 pg (ref 26.6–33.0)
MCHC: 32.3 g/dL (ref 31.5–35.7)
MCV: 90 fL (ref 79–97)
Platelets: 272 x10E3/uL (ref 150–450)
RBC: 4.51 x10E6/uL (ref 3.77–5.28)
RDW: 13.2 % (ref 11.7–15.4)
WBC: 5.5 x10E3/uL (ref 3.4–10.8)

## 2024-05-25 ENCOUNTER — Telehealth (HOSPITAL_COMMUNITY): Payer: Self-pay

## 2024-05-25 NOTE — Telephone Encounter (Signed)
 Spoke with patient to discuss upcoming procedure.   CT: completed.  Labs: completed.   Any recent signs of acute illness or been started on antibiotics? yes/no: No Any new medications started?yes/no: No Any medications to hold?  yes/no: No Any missed doses of blood thinner?  yes/no: No Advised patient to continue taking ANTICOAGULANT: Eliquis  (Apixaban ) twice daily without missing any doses.  Medication instructions:  On the morning of your procedure DO NOT take any medication., including Eliquis  (Apixaban ) or the procedure may be rescheduled. Nothing to eat or drink after midnight prior to your procedure.  Confirmed patient is scheduled for Atrial Fibrillation Ablation on Thursday, September 4 with Dr. Sidra Kitty. Instructed patient to arrive at the Main Entrance A at [facility]: [address] [place], [place] [address] and check in at Admitting at 5:30 AM.   Advised of plan to go home the same day and will only stay overnight if medically necessary. You MUST have a responsible adult to drive you home and MUST be with you the first 24 hours after you arrive home or your procedure could be cancelled.  Informed patient a nurse will call a day before the procedure to confirm arrival time and ensure instructions are followed.  Patient verbalized understanding to all instructions provided and agreed to proceed with procedure.   Advised patient to contact RN Navigator at [phone], to inform of any new medications started after call or concerns prior to procedure.

## 2024-05-30 NOTE — Pre-Procedure Instructions (Signed)
Attempted to call patient regarding procedure instructions.  No answer 

## 2024-05-31 ENCOUNTER — Ambulatory Visit (HOSPITAL_COMMUNITY): Admitting: Certified Registered"

## 2024-05-31 ENCOUNTER — Ambulatory Visit (HOSPITAL_COMMUNITY)
Admission: RE | Admit: 2024-05-31 | Discharge: 2024-05-31 | Disposition: A | Discharge status: Home / Self Care | Attending: Cardiology | Admitting: Cardiology

## 2024-05-31 ENCOUNTER — Other Ambulatory Visit: Payer: Self-pay

## 2024-05-31 ENCOUNTER — Ambulatory Visit (HOSPITAL_COMMUNITY)
Admission: RE | Disposition: A | Discharge status: Home / Self Care | Payer: Self-pay | Source: Home / Self Care | Attending: Cardiology

## 2024-05-31 DIAGNOSIS — I110 Hypertensive heart disease with heart failure: Secondary | ICD-10-CM | POA: Diagnosis not present

## 2024-05-31 DIAGNOSIS — E039 Hypothyroidism, unspecified: Secondary | ICD-10-CM

## 2024-05-31 DIAGNOSIS — I10 Essential (primary) hypertension: Secondary | ICD-10-CM | POA: Diagnosis not present

## 2024-05-31 DIAGNOSIS — I4819 Other persistent atrial fibrillation: Secondary | ICD-10-CM | POA: Diagnosis present

## 2024-05-31 DIAGNOSIS — D6869 Other thrombophilia: Secondary | ICD-10-CM | POA: Diagnosis not present

## 2024-05-31 DIAGNOSIS — Z7901 Long term (current) use of anticoagulants: Secondary | ICD-10-CM | POA: Insufficient documentation

## 2024-05-31 DIAGNOSIS — I5032 Chronic diastolic (congestive) heart failure: Secondary | ICD-10-CM | POA: Insufficient documentation

## 2024-05-31 DIAGNOSIS — Z79899 Other long term (current) drug therapy: Secondary | ICD-10-CM | POA: Insufficient documentation

## 2024-05-31 HISTORY — PX: ATRIAL FIBRILLATION ABLATION: EP1191

## 2024-05-31 LAB — POCT ACTIVATED CLOTTING TIME: Activated Clotting Time: 412 s

## 2024-05-31 SURGERY — ATRIAL FIBRILLATION ABLATION
Anesthesia: General

## 2024-05-31 MED ORDER — ONDANSETRON HCL 4 MG/2ML IJ SOLN
INTRAMUSCULAR | Status: DC | PRN
  Administered 2024-05-31: 4 mg via INTRAVENOUS

## 2024-05-31 MED ORDER — DEXAMETHASONE SODIUM PHOSPHATE 10 MG/ML IJ SOLN
INTRAMUSCULAR | Status: DC | PRN
  Administered 2024-05-31: 5 mg via INTRAVENOUS

## 2024-05-31 MED ORDER — ACETAMINOPHEN 325 MG PO TABS
650.0000 mg | ORAL_TABLET | ORAL | Status: DC | PRN
  Administered 2024-05-31: 650 mg via ORAL
  Filled 2024-05-31: qty 2

## 2024-05-31 MED ORDER — PROTAMINE SULFATE 10 MG/ML IV SOLN
INTRAVENOUS | Status: DC | PRN
  Administered 2024-05-31: 35 mg via INTRAVENOUS

## 2024-05-31 MED ORDER — PROPOFOL 10 MG/ML IV BOLUS
INTRAVENOUS | Status: DC | PRN
  Administered 2024-05-31: 120 mg via INTRAVENOUS

## 2024-05-31 MED ORDER — SODIUM CHLORIDE 0.9% FLUSH: 3.0000 mL | Freq: Two times a day (BID) | INTRAVENOUS | Status: DC

## 2024-05-31 MED ORDER — SODIUM CHLORIDE 0.9% FLUSH: 3.0000 mL | INTRAVENOUS | Status: DC | PRN

## 2024-05-31 MED ORDER — HEPARIN SODIUM (PORCINE) 1000 UNIT/ML IJ SOLN
INTRAMUSCULAR | Status: DC | PRN
  Administered 2024-05-31: 16000 [IU] via INTRAVENOUS

## 2024-05-31 MED ORDER — FENTANYL CITRATE (PF) 250 MCG/5ML IJ SOLN
INTRAMUSCULAR | Status: DC | PRN
  Administered 2024-05-31: 50 ug via INTRAVENOUS

## 2024-05-31 MED ORDER — ROCURONIUM BROMIDE 10 MG/ML (PF) SYRINGE
PREFILLED_SYRINGE | INTRAVENOUS | Status: DC | PRN
  Administered 2024-05-31: 10 mg via INTRAVENOUS
  Administered 2024-05-31: 50 mg via INTRAVENOUS

## 2024-05-31 MED ORDER — ATROPINE SULFATE 1 MG/10ML IJ SOSY
PREFILLED_SYRINGE | INTRAMUSCULAR | Status: AC
  Filled 2024-05-31: qty 10

## 2024-05-31 MED ORDER — PHENYLEPHRINE HCL-NACL 20-0.9 MG/250ML-% IV SOLN
INTRAVENOUS | Status: DC | PRN
  Administered 2024-05-31: 25 ug/min via INTRAVENOUS

## 2024-05-31 MED ORDER — SODIUM CHLORIDE 0.9 % IV SOLN: INTRAVENOUS | Status: DC

## 2024-05-31 MED ORDER — FENTANYL CITRATE (PF) 100 MCG/2ML IJ SOLN
INTRAMUSCULAR | Status: AC
Start: 2024-05-31 — End: 2024-05-31
  Filled 2024-05-31: qty 2

## 2024-05-31 MED ORDER — SODIUM CHLORIDE 0.9 % IV SOLN: 250.0000 mL | INTRAVENOUS | Status: DC | PRN

## 2024-05-31 MED ORDER — ATROPINE SULFATE 1 MG/ML IV SOLN
INTRAVENOUS | Status: DC | PRN
  Administered 2024-05-31: 1 mg via INTRAVENOUS

## 2024-05-31 MED ORDER — HEPARIN (PORCINE) IN NACL 1000-0.9 UT/500ML-% IV SOLN
INTRAVENOUS | Status: DC | PRN
Start: 2024-05-31 — End: 2024-05-31
  Administered 2024-05-31 (×3): 500 mL

## 2024-05-31 MED ORDER — APIXABAN 5 MG PO TABS
5.0000 mg | ORAL_TABLET | Freq: Once | ORAL | Status: AC
  Administered 2024-05-31: 5 mg via ORAL
  Filled 2024-05-31: qty 1

## 2024-05-31 MED ORDER — LIDOCAINE 2% (20 MG/ML) 5 ML SYRINGE
INTRAMUSCULAR | Status: DC | PRN
  Administered 2024-05-31: 8 mg via INTRAVENOUS

## 2024-05-31 MED ORDER — ONDANSETRON HCL 4 MG/2ML IJ SOLN: 4.0000 mg | Freq: Four times a day (QID) | INTRAMUSCULAR | Status: DC | PRN

## 2024-05-31 MED ORDER — AMIODARONE HCL 100 MG PO TABS
100.0000 mg | ORAL_TABLET | Freq: Every day | ORAL | 0 refills | Status: DC
Start: 2024-05-31 — End: 2024-06-29

## 2024-05-31 MED ORDER — PHENYLEPHRINE 80 MCG/ML (10ML) SYRINGE FOR IV PUSH (FOR BLOOD PRESSURE SUPPORT)
PREFILLED_SYRINGE | INTRAVENOUS | Status: DC | PRN
Start: 2024-05-31 — End: 2024-05-31
  Administered 2024-05-31 (×2): 160 ug via INTRAVENOUS

## 2024-05-31 MED ORDER — SUGAMMADEX SODIUM 200 MG/2ML IV SOLN
INTRAVENOUS | Status: DC | PRN
  Administered 2024-05-31: 150 mg via INTRAVENOUS
  Administered 2024-05-31: 50 mg via INTRAVENOUS

## 2024-05-31 SURGICAL SUPPLY — 21 items
BAG SNAP BAND KOVER 36X36 (MISCELLANEOUS) IMPLANT
BLANKET WARM UNDERBOD FULL ACC (MISCELLANEOUS) ×1 IMPLANT
CABLE FARASTAR GEN2 SNGL USE (CABLE) IMPLANT
CATH BI DIR 7FR CS F-J 12 PIN (CATHETERS) IMPLANT
CATH FARAWAVE 2.0 31 (CATHETERS) IMPLANT
CATH GE 8FR SOUNDSTAR (CATHETERS) IMPLANT
CATH OCTARAY 2.0 F 3-3-3-3-3 (CATHETERS) IMPLANT
CLOSURE PERCLOSE PROSTYLE (VASCULAR PRODUCTS) IMPLANT
COVER SWIFTLINK CONNECTOR (BAG) ×1 IMPLANT
DEVICE CLOSURE MYNXGRIP 6/7F (Vascular Products) IMPLANT
DILATOR VESSEL 38 20CM 16FR (INTRODUCER) IMPLANT
GUIDEWIRE INQWIRE 1.5J.035X260 (WIRE) IMPLANT
KIT VERSACROSS CNCT FARADRIVE (KITS) IMPLANT
PACK EP LF (CUSTOM PROCEDURE TRAY) ×1 IMPLANT
PAD DEFIB RADIO PHYSIO CONN (PAD) ×1 IMPLANT
PATCH CARTO3 (PAD) IMPLANT
SHEATH FARADRIVE STEERABLE (SHEATH) IMPLANT
SHEATH PINNACLE 5F 10CM (SHEATH) IMPLANT
SHEATH PINNACLE 8F 10CM (SHEATH) IMPLANT
SHEATH PINNACLE VASC 9FR (SHEATH) IMPLANT
SHEATH PROBE COVER 6X72 (BAG) IMPLANT

## 2024-05-31 NOTE — Anesthesia Procedure Notes (Signed)
 Procedure Name: Intubation Date/Time: 05/31/2024 7:49 AM  Performed by: Delores Dus, CRNAPre-anesthesia Checklist: Patient identified, Emergency Drugs available, Suction available and Patient being monitored Patient Re-evaluated:Patient Re-evaluated prior to induction Oxygen Delivery Method: Circle system utilized Preoxygenation: Pre-oxygenation with 100% oxygen Induction Type: IV induction Ventilation: Mask ventilation without difficulty Laryngoscope Size: Miller and 2 Grade View: Grade II Tube type: Oral Tube size: 7.0 mm Number of attempts: 1 Airway Equipment and Method: Stylet and Oral airway Placement Confirmation: ETT inserted through vocal cords under direct vision, positive ETCO2 and breath sounds checked- equal and bilateral Secured at: 22 cm Tube secured with: Tape Dental Injury: Teeth and Oropharynx as per pre-operative assessment  Comments: Anterior view-cricoid pressure and head lift required

## 2024-05-31 NOTE — Anesthesia Preprocedure Evaluation (Addendum)
 Anesthesia Evaluation  Patient identified by MRN, date of birth, ID band Patient awake    Reviewed: Allergy & Precautions, NPO status , Patient's Chart, lab work & pertinent test results, reviewed documented beta blocker date and time   Airway Mallampati: III  TM Distance: >3 FB Neck ROM: Full    Dental no notable dental hx. (+) Teeth Intact, Dental Advisory Given   Pulmonary neg pulmonary ROS   Pulmonary exam normal breath sounds clear to auscultation       Cardiovascular hypertension, Pt. on medications and Pt. on home beta blockers Normal cardiovascular exam+ dysrhythmias (eliquis ) Atrial Fibrillation + Valvular Problems/Murmurs (mild MR) MR  Rhythm:Irregular Rate:Normal  Echo 03/2024  1. Left ventricular ejection fraction, by estimation, is 60 to 65%. The  left ventricle has normal function. The left ventricle has no regional  wall motion abnormalities. There is mild concentric left ventricular  hypertrophy. Left ventricular diastolic  function could not be evaluated.   2. Right ventricular systolic function is normal. The right ventricular  size is normal. There is normal pulmonary artery systolic pressure.   3. Left atrial size was moderately dilated.   4. The mitral valve is abnormal. Mild mitral valve regurgitation. No  evidence of mitral stenosis. Moderate mitral annular calcification.   5. The aortic valve is normal in structure. Aortic valve regurgitation is  not visualized. No aortic stenosis is present.   6. The inferior vena cava is normal in size with greater than 50%  respiratory variability, suggesting right atrial pressure of 3 mmHg.      Neuro/Psych negative neurological ROS  negative psych ROS   GI/Hepatic negative GI ROS, Neg liver ROS,,,  Endo/Other  Hypothyroidism    Renal/GU negative Renal ROS  negative genitourinary   Musculoskeletal  (+) Arthritis , Osteoarthritis,    Abdominal   Peds   Hematology negative hematology ROS (+)   Anesthesia Other Findings   Reproductive/Obstetrics negative OB ROS                              Anesthesia Physical Anesthesia Plan  ASA: 3  Anesthesia Plan: General   Post-op Pain Management: Minimal or no pain anticipated   Induction: Intravenous  PONV Risk Score and Plan: 3 and Ondansetron , Dexamethasone  and Treatment may vary due to age or medical condition  Airway Management Planned: Oral ETT  Additional Equipment: None  Intra-op Plan:   Post-operative Plan: Extubation in OR  Informed Consent:   Plan Discussed with:   Anesthesia Plan Comments:          Anesthesia Quick Evaluation

## 2024-05-31 NOTE — Discharge Instructions (Addendum)
 Stop amiodarone  in 1 month.  Cardiac Ablation, Care After  This sheet gives you information about how to care for yourself after your procedure. Your health care provider may also give you more specific instructions. If you have problems or questions, contact your health care provider. What can I expect after the procedure? After the procedure, it is common to have: Bruising around your puncture site. Tenderness around your puncture site. Skipped heartbeats. If you had an atrial fibrillation ablation, you may have atrial fibrillation during the first several months after your procedure.  Tiredness (fatigue).  Follow these instructions at home: Puncture site care  Follow instructions from your health care provider about how to take care of your puncture site. Make sure you: If present, leave stitches (sutures), skin glue, or adhesive strips in place. These skin closures may need to stay in place for up to 2 weeks. If adhesive strip edges start to loosen and curl up, you may trim the loose edges. Do not remove adhesive strips completely unless your health care provider tells you to do that. If a large square bandage is present, this may be removed 24 hours after surgery.  Check your puncture site every day for signs of infection. Check for: Redness, swelling, or pain. Fluid or blood. If your puncture site starts to bleed, lie down on your back, apply firm pressure to the area, and contact your health care provider. Dr Shaune [facility]. Pus or a bad smell. A pea or marble sized lump/knot at the site is normal and can take up to three months to resolve.  Driving Do not drive for at least 4 days after your procedure or however long your health care provider recommends. (Do not resume driving if you have previously been instructed not to drive for other health reasons.) Do not drive or use heavy machinery while taking prescription pain medicine. Activity Avoid activities that take a lot of  effort for at least 7 days after your procedure. Do not lift anything that is heavier than 5 lb (4.5 kg) for one week.  No sexual activity for 1 week.  Return to your normal activities as told by your health care provider. Ask your health care provider what activities are safe for you. General instructions Take over-the-counter and prescription medicines only as told by your health care provider. Do not use any products that contain nicotine  or tobacco, such as cigarettes and e-cigarettes. If you need help quitting, ask your health care provider. You may shower after 24 hours, but Do not take baths, swim, or use a hot tub for 1 week.  Do not drink alcohol  for 24 hours after your procedure. Keep all follow-up visits as told by your health care provider. This is important. Contact a health care provider if: You have redness, mild swelling, or pain around your puncture site. You have fluid or blood coming from your puncture site that stops after applying firm pressure to the area. Your puncture site feels warm to the touch. You have pus or a bad smell coming from your puncture site. You have a fever. You have chest pain or discomfort that spreads to your neck, jaw, or arm. You have chest pain that is worse with lying on your back or taking a deep breath. You are sweating a lot. You feel nauseous. You have a fast or irregular heartbeat. You have shortness of breath. You are dizzy or light-headed and feel the need to lie down. You have pain or numbness in  the arm or leg closest to your puncture site. Get help right away if: Your puncture site suddenly swells. Your puncture site is bleeding and the bleeding does not stop after applying firm pressure to the area. These symptoms may represent a serious problem that is an emergency. Do not wait to see if the symptoms will go away. Get medical help right away. Call your local emergency services (911 in the [place] Do not drive yourself to the  hospital. Summary After the procedure, it is normal to have bruising and tenderness at the puncture site in your groin, neck, or forearm. Check your puncture site every day for signs of infection. Get help right away if your puncture site is bleeding and the bleeding does not stop after applying firm pressure to the area. This is a medical emergency. This information is not intended to replace advice given to you by your health care provider. Make sure you discuss any questions you have with your health care provider.

## 2024-05-31 NOTE — Anesthesia Postprocedure Evaluation (Signed)
 Anesthesia Post Note  Patient: Michelle Watkins  Procedure(s) Performed: ATRIAL FIBRILLATION ABLATION     Patient location during evaluation: PACU Anesthesia Type: General Level of consciousness: awake and alert Pain management: pain level controlled Vital Signs Assessment: post-procedure vital signs reviewed and stable Respiratory status: spontaneous breathing, nonlabored ventilation, respiratory function stable and patient connected to nasal cannula oxygen Cardiovascular status: blood pressure returned to baseline and stable Postop Assessment: no apparent nausea or vomiting Anesthetic complications: no   There were no known notable events for this encounter.  Last Vitals:  Vitals:   05/31/24 1300 05/31/24 1330  BP: 132/68 135/64  Pulse: 66 (!) 59  Resp: 20 17  Temp:    SpO2: 92% 94%    Last Pain:  Vitals:   05/31/24 1130  TempSrc:   PainSc: 4                  Kenroy Timberman L Livvy Spilman

## 2024-05-31 NOTE — Transfer of Care (Signed)
 Immediate Anesthesia Transfer of Care Note  Patient: Michelle Watkins  Procedure(s) Performed: ATRIAL FIBRILLATION ABLATION  Patient Location: PACU  Anesthesia Type:General  Level of Consciousness: awake and sedated  Airway & Oxygen Therapy: Patient Spontanous Breathing and Patient connected to nasal cannula oxygen  Post-op Assessment: Report given to RN and Post -op Vital signs reviewed and stable  Post vital signs: Reviewed and stable  Last Vitals:  Vitals Value Taken Time  BP 114/65 05/31/24 09:23  Temp    Pulse 61 05/31/24 09:25  Resp 18 05/31/24 09:25  SpO2 96 % 05/31/24 09:25  Vitals shown include unfiled device data.  Last Pain:  Vitals:   05/31/24 0558  TempSrc:   PainSc: 0-No pain         Complications: There were no known notable events for this encounter.

## 2024-05-31 NOTE — H&P (Signed)
 Electrophysiology Note:   Date:  05/31/24  ID:  Michelle Watkins, DOB [birth year], MRN [id number]   Primary Cardiologist: Ozell Fell, MD Electrophysiologist: [name], MD       History of Present Illness:   Michelle Watkins is a 78 y.o. female with h/o persistent atrial fibrillation, HFimpEF presumptively tachy/arrhythmia mediated, HTN who is being seen today for evaluation for catheter ablation at the request of Dr. Fell.    Discussed the use of AI scribe software for clinical note transcription with the patient, who gave verbal consent to proceed.   History of Present Illness Michelle Watkins is a 78 year old female with atrial fibrillation who presents for consultation regarding management of her condition. She was referred by Dr. Fell for evaluation of her atrial fibrillation.   She has experienced atrial fibrillation for three to four years, undergoing three cardioversions, with the most recent on March 02, 2024. Symptoms recurred two days later, characterized by a high and irregular heart rate, leading to adjustments in her medication regimen. Despite these changes, her heart rate remains elevated. Her current medications include metoprolol , adjusted to half a pill three times a day. Hydralazine  was reduced to one tablet three times a day to accommodate this change. She has been on amiodarone  since her diagnosis, initially at a small dose, and after her recent episode, her dosing was changed from five days a week to daily dosing at 100 mg per day. During episodes of atrial fibrillation, she feels worse. She is tolerating Eliquis . 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 She is concerned about the risk of falling due to her osteoporosis.   Interval: Patient presents today for scheduled ablation. Reports feeling relatively well. No new or acute complaints today.   Review of systems complete and  found to be negative unless listed in HPI.    EP Information / Studies Reviewed:     EKG is not ordered today. EKG from 03/02/24 reviewed which showed AF with RVR.       EKG 03/02/24:     Echo 02/04/21:   1. Left ventricular ejection fraction by 3D volume is 69 %. The left  ventricle has no regional wall motion abnormalities.   2. Right ventricular systolic function is normal. The right ventricular  size is normal. There is normal pulmonary artery systolic pressure.   3. Left atrial size was mildly dilated.   4. The mitral valve is normal in structure. Trivial mitral valve  regurgitation. No evidence of mitral stenosis.   5. The aortic valve is normal in structure. Aortic valve regurgitation is  not visualized. No aortic stenosis is present.   6. The inferior vena cava is normal in size with greater than 50%  respiratory variability, suggesting right atrial pressure of 3 mmHg.    Risk Assessment/Calculations:     CHA2DS2-VASc Score = 5   This indicates a 7.2% annual risk of stroke. The patient's score is based upon: CHF History: 1 HTN History: 1 Diabetes History: 0 Stroke History: 0 Vascular Disease History: 0 Age Score: 2 Gender Score: 1               Physical Exam:    Today's Vitals   05/31/24 0547 05/31/24 0558  BP: (!) 152/92   Pulse: (!) 103   Resp: 17   Temp: 98.2 F (36.8 C)   TempSrc: Oral   SpO2: 98%  Weight: 90.7 kg   Height: 5' 2 (1.575 m)   PainSc:  0-No pain   Body mass index is 36.58 kg/m.  GEN: Well nourished, well developed in no acute distress NECK: No JVD CARDIAC: Tachycardic, irregular RESPIRATORY:  Clear to auscultation without rales, wheezing or rhonchi  ABDOMEN: Soft, non-distended EXTREMITIES:  No edema; No deformity    ASSESSMENT AND PLAN:     #. Persistent atrial fibrillation, symptomatic: Associated with decline in LVEF. For this reason, rhythm control strategy has been chosen.  -Discussed treatment options today for AF  including antiarrhythmic drug therapy and ablation. Discussed risks, recovery and likelihood of success with each treatment strategy. Risk, benefits, and alternatives to EP study and ablation for afib were discussed. These risks include but are not limited to stroke, bleeding, vascular damage, tamponade, perforation, damage to the esophagus, lungs, phrenic nerve and other structures, pulmonary vein stenosis, worsening renal function, coronary vasospasm and death.  Discussed potential need for repeat ablation procedures and antiarrhythmic drugs after an initial ablation. The patient understands these risk and wishes to proceed today. -Continue amiodarone  100mg  daily. Plan to stop in 1 month. -Continue metoprolol  12.5mg  TID.    #. Secondary hypercoagulable state due to AF:  -Continue Eliquis  5mg  BID.   #. HFimpEF: Presumptively tachy/arrhythmia induced. Well compensated today. - Rhythm control strategy as above.      Follow up with Dr. Kennyth 3 months after ablation.    Signed, Fonda Kennyth, MD

## 2024-06-01 ENCOUNTER — Telehealth (HOSPITAL_COMMUNITY): Payer: Self-pay

## 2024-06-01 ENCOUNTER — Telehealth: Payer: Self-pay | Admitting: Cardiology

## 2024-06-01 ENCOUNTER — Encounter (HOSPITAL_COMMUNITY): Payer: Self-pay | Admitting: Cardiology

## 2024-06-01 MED FILL — Atropine Sulfate Soln Prefill Syr 1 MG/10ML (0.1 MG/ML): INTRAMUSCULAR | Qty: 10 | Status: AC

## 2024-06-01 MED FILL — Fentanyl Citrate Preservative Free (PF) Inj 100 MCG/2ML: INTRAMUSCULAR | Qty: 1 | Status: AC

## 2024-06-01 NOTE — Telephone Encounter (Signed)
 Returned patient's call regarding medication instructions for amiodarone  per discharge AVS given to patient at ablation 05/31/24.  Patient states she recently filled a bottle of 100 mg amiodarone  tablets and has plenty on hand. However, she states when she left her ablation yesterday, she received a notification from her pharmacy that her insurance denied a new fill of her 100 mg amiodarone  tablets that Dr. Kennyth reordered at discharge. She states she understands that she is to take 100 mg daily but got concerned because on the discharge paperwork it said: Change how you are taking this medication.   Patient's current fill was prescribed by Dr. Wonda on 04/16/24. Medication Instructions say Please see attached so patient was confused about whether her dose/frequnecy had changed. I advised patient to go by the discharge instructions from yesterday which state to take 100 mg of amiodarone  daily. Patient verbalizes understanding.

## 2024-06-01 NOTE — Telephone Encounter (Signed)
 Attempted to call patient to clarify amiodarone  orders. No answer. Phone # listed on DPR is out of service. Called [phone], no answer. Left VM with no identifiers asking recipient to call our office. Patient does not have [facility] account.

## 2024-06-01 NOTE — Telephone Encounter (Signed)
 Patient returned RN's call.

## 2024-06-01 NOTE — Telephone Encounter (Signed)
 Spoke with patient to complete post procedure follow up call.  Patient reports no complications with groin sites.   Instructions reviewed with patient:  Remove large bandage at puncture site after 24 hours. It is normal to have bruising, tenderness, mild swelling, and a pea or marble sized lump/knot at the groin site which can take up to three months to resolve.  Get help right away if you notice sudden swelling at the puncture site.  Check your puncture site every day for signs of infection: fever, redness, swelling, pus drainage, warmth, foul odor or excessive pain. If this occurs, please call [phone], to speak with the RN Navigator. Get help right away if your puncture site is bleeding and the bleeding does not stop after applying firm pressure to the area.  You may continue to have skipped beats/ atrial fibrillation during the first several months after your procedure.  It is very important not to miss any doses of your blood thinner Eliquis . Stop Amiodarone  in 1 month per Dr. Kennyth.    You will follow up with the Afib clinic on 06/28/24 and follow up with Dr.Josh Kennyth on 08/30/24.     Patient verbalized understanding to all instructions provided.

## 2024-06-01 NOTE — Telephone Encounter (Signed)
 Pt c/o medication issue:  1. Name of Medication: amiodarone  (PACERONE ) 100 MG tablet   2. How are you currently taking this medication (dosage and times per day)? As written   3. Are you having a reaction (difficulty breathing--STAT)? No   4. What is your medication issue? Pt called in stating Dr. Kennyth told her this medication would be changing but she is not sure how and she states [facility] a notification about this med but they said its too soon to fill unless instructions have changed. Please advise.

## 2024-06-01 NOTE — Telephone Encounter (Signed)
 Patient returning call, explained that per Dr. Kennyth here are no changes to her medications other than a stop date for amiodarone  on 06/30/24. Patient verbalizes understanding.

## 2024-06-05 ENCOUNTER — Telehealth: Payer: Self-pay | Admitting: Cardiology

## 2024-06-05 ENCOUNTER — Other Ambulatory Visit: Payer: Self-pay | Admitting: Cardiovascular Disease

## 2024-06-05 NOTE — Telephone Encounter (Signed)
 Patient was returning call. Please advise ?

## 2024-06-05 NOTE — Telephone Encounter (Signed)
 Michelle Kitty, MD 06/03/2024 11:01 AM EDT     Start atorvastatin 20mg  daily for coronary calcification.   The patient has been notified of the result and verbalized understanding.  All questions (if any) were answered. Ersel Enslin Chauvigne, RN 06/05/2024 5:31 PM   Patient states that she would prefer to work on her diet and exercise prior to starting any cholesterol medications.

## 2024-06-05 NOTE — Telephone Encounter (Signed)
 Were you trying to contact this patient?

## 2024-06-25 NOTE — Progress Notes (Signed)
 [facility] FOLLOW UP VISIT   CC/Reason for visit: Primary hypothyroidism   HPI:  This is a 78 y.o. female who presents for a follow up visit.  The following issues were addressed in clinic today:  1.Primary hypothyroidism :   Last visit 12/20/23   Patient has history of primary hypothyroidism, history of a goiter, history of A-fib  Recently was in a.fib and had cardiac ablation earlier this month. She is now taking amiodarone  daily.   Feels that her heart rate is elevated and has to do everything slower.     Currently taking brand-name Synthroid 125 mcg tablets, 1 tablet daily Monday through Saturday and 2 tablets on Sunday.  A total of 8 tablets/week.   Reports feeling well.  Denies any thyroid  related complaints today.  Denies neck swelling and difficulty swallowing.  Denies diarrhea and constipation.         Objective: BP 116/64 (BP Location: Left arm, Patient Position: Sitting)   Pulse 101   Wt 94 kg (207 lb 3.2 oz)   SpO2 98%   BMI 37.90 kg/m   Physical Exam: GENERAL:  alert, well-hydrated, well-nourished, and in no acute distress EYES: pupils equally round; no proptosis, lid lag, or stare  VISION: grossly intact THYROID : no visible goiter CARDIOVASCULAR: HR 101 RESPIRATORY:  Normal respiratory effort EXTREMITIES:  Lower extremities warm, well perfused, no edema noted MSK: no deficits noted SKIN: no rashes, wounds, nail or skin changes NEURO:  alert, aware, answers questions appropriately PSYCH: pleasant mood   Pertinent Labs:  Lab Results  Component Value Date   TSH 0.953 06/21/2024   TSH 1.715 12/14/2023   TSH 2.452 04/28/2023   FREET4 1.6 (H) 06/21/2024   FREET4 1.8 (H) 12/14/2023   FREET4 1.7 (H) 04/28/2023    No results found for: REDGIE MAYLENE SHELLING, TSI  ASSESSMENT/PLAN: Michelle Watkins is a 78 y.o. female with a past medical history of above who presents for a follow up consultation visit.  Primary  hypothyroidism: Reviewed recent thyroid  function test with patient.  With TSH (0.953) and slightly elevated Free T4 (1.6) and with recent event of A.fib, will decrease Synthroid to 125 mcg daily, no longer taking two tablets on Sunday. Will repeat TFT in 4-8 weeks after dose change.  She will follow-up in 6 months or sooner if needed  -Adjust Synthroid 125 mcg to 1 tablet daily   -Repeat thyroid  labs 4-8 weeks after dose change -Lab open M-F 8am-5pm. Closed weekend and holidays   Return in about 6 months (around 12/23/2024).     Electronically signed by: Warren Gleen Batty, FNP 06/25/2024 2:54 PM

## 2024-06-28 ENCOUNTER — Ambulatory Visit (HOSPITAL_COMMUNITY)
Admit: 2024-06-28 | Discharge: 2024-06-28 | Disposition: A | Discharge status: Home / Self Care | Attending: Internal Medicine | Admitting: Internal Medicine

## 2024-06-28 ENCOUNTER — Encounter (HOSPITAL_COMMUNITY): Payer: Self-pay | Admitting: Internal Medicine

## 2024-06-28 VITALS — BP 118/72 | HR 94 | Ht 62.0 in | Wt 206.4 lb

## 2024-06-28 DIAGNOSIS — D6869 Other thrombophilia: Secondary | ICD-10-CM

## 2024-06-28 DIAGNOSIS — Z5181 Encounter for therapeutic drug level monitoring: Secondary | ICD-10-CM | POA: Diagnosis not present

## 2024-06-28 DIAGNOSIS — Z79899 Other long term (current) drug therapy: Secondary | ICD-10-CM

## 2024-06-28 DIAGNOSIS — I4819 Other persistent atrial fibrillation: Secondary | ICD-10-CM | POA: Diagnosis not present

## 2024-06-28 NOTE — H&P (View-Only) (Signed)
Primary Care Physician: Nanci Senior, MD Referring Physician: Dr. Wonda  Endocrinologist: Dr. Mirna    Michelle Watkins is a 78 y.o. female with a history of HFimpEF, HTN, hypothyroidism, HLD, and persistent atrial fibrillation. Patient is on Eliquis  5 mg BID for a Chadsvasc score of at least 5.   On follow up 06/28/24, patient is currently in Afib. S/p Afib ablation on 05/31/24 by Dr. Kennyth. She is surprised to find out she is in Afib today; states it does not feel the same as before the ablation. She is taking amiodarone  100 mg daily. Patient notes she was seen by endocrinology yesterday and medication adjusted. No chest pain or SOB. Leg sites healed without issue. No missed doses of anticoagulant.  Today, she denies symptoms of orthopnea, PND, lower extremity edema, dizziness, presyncope, syncope, bleeding, or neurologic sequela. The patient is tolerating medications without difficulties and is otherwise without complaint today.    Past Medical History:  Diagnosis Date   HFrEF (heart failure with reduced ejection fraction) (HCC)    EF 30-35, global HK, low normal RVSF, RVSP 39.6, mild RAE, mod MR // Echo 5/22: EF 69, no RWMA, normal RVSF, mild LAE, trivial MR   Hypertension    Persistent atrial fibrillation (HCC)    Amio Rx//Apixaban  Rx // ZIO monitor 6/22: NSR, average heart rate 57; no pauses or atrial fibrillation; short SVT runs without sustained arrhythmia; PACs/PVCs (<1%)    Thyroid  disease    Past Surgical History:  Procedure Laterality Date   APPENDECTOMY     ATRIAL FIBRILLATION ABLATION N/A 05/31/2024   Procedure: ATRIAL FIBRILLATION ABLATION;  Surgeon: Kennyth Chew, MD;  Location: [facility];  Service: Cardiovascular;  Laterality: N/A;   CARDIOVERSION N/A 08/15/2020   Procedure: CARDIOVERSION;  Surgeon: Francyne Headland, MD;  Location: [facility];  Service: Cardiovascular;  Laterality: N/A;   HYSTERECTOMY ABDOMINAL WITH SALPINGECTOMY      Current  Outpatient Medications  Medication Sig Dispense Refill   acetaminophen  (TYLENOL ) 650 MG CR tablet Take 650 mg by mouth every 8 (eight) hours as needed for pain.     amiodarone  (PACERONE ) 100 MG tablet Take 1 tablet (100 mg total) by mouth daily. 30 tablet 0   ELIQUIS  5 MG TABS tablet TAKE 1 TABLET BY MOUTH TWICE A DAY 180 tablet 1   hydrALAZINE  (APRESOLINE ) 25 MG tablet Take 25 mg by mouth 3 (three) times daily.     loratadine (CLARITIN) 10 MG tablet Take 10 mg by mouth daily as needed for allergies.     metoprolol  tartrate (LOPRESSOR ) 25 MG tablet Take 0.5 tablets (12.5 mg total) by mouth 2 (two) times daily. (Patient taking differently: Take 12.5 mg by mouth 3 (three) times daily.) 45 tablet 3   spironolactone  (ALDACTONE ) 25 MG tablet Take 1 tablet (25 mg total) by mouth daily. 90 tablet 3   SYNTHROID 125 MCG tablet Take 125-250 mcg by mouth See admin instructions. 125 mcg daily every morning, and 250 mcg on Sunday Mornings     valsartan  (DIOVAN ) 80 MG tablet Take 1 tablet (80 mg total) by mouth 2 (two) times daily. 180 tablet 3   No current facility-administered medications for this encounter.    Allergies  Allergen Reactions   Amlodipine  Swelling    LEE   Penicillins Rash   Sulfamethoxazole Rash    ROS- All systems are reviewed and negative except as per the HPI above  Physical Exam: Vitals:   06/28/24 1431  BP: 118/72  Pulse: 94  Weight: 93.6 kg  Height: 5' 2 (1.575 m)   Wt Readings from Last 3 Encounters:  06/28/24 93.6 kg  05/31/24 90.7 kg  03/02/24 93 kg    Labs: Lab Results  Component Value Date   NA 137 05/10/2024   K 4.6 05/10/2024   CL 101 05/10/2024   CO2 20 05/10/2024   GLUCOSE 79 05/10/2024   BUN 19 05/10/2024   CREATININE 1.17 (H) 05/10/2024   CALCIUM 9.2 05/10/2024   No results found for: INR No results found for: CHOL, HDL, LDLCALC, TRIG  GEN- The patient is well appearing, alert and oriented x 3 today.   Neck - no JVD or carotid  bruit noted Lungs- Clear to ausculation bilaterally, normal work of breathing Heart- Irregular rate and rhythm, no murmurs, rubs or gallops, PMI not laterally displaced Extremities- no clubbing, cyanosis, or edema Skin - no rash or ecchymosis noted   ECG Vent. rate 94 BPM PR interval * ms QRS duration 76 ms QT/QTcB 372/465 ms P-R-T axes * -6 55 Atrial fibrillation Low voltage QRS Cannot rule out Anterior infarct , age undetermined Abnormal ECG When compared with ECG of 31-May-2024 09:49, Atrial fibrillation has replaced Sinus rhythm  Echo 04/25/24:  1. Left ventricular ejection fraction, by estimation, is 60 to 65%. The  left ventricle has normal function. The left ventricle has no regional  wall motion abnormalities. There is mild concentric left ventricular  hypertrophy. Left ventricular diastolic  function could not be evaluated.   2. Right ventricular systolic function is normal. The right ventricular  size is normal. There is normal pulmonary artery systolic pressure.   3. Left atrial size was moderately dilated.   4. The mitral valve is abnormal. Mild mitral valve regurgitation. No  evidence of mitral stenosis. Moderate mitral annular calcification.   5. The aortic valve is normal in structure. Aortic valve regurgitation is  not visualized. No aortic stenosis is present.   6. The inferior vena cava is normal in size with greater than 50%  respiratory variability, suggesting right atrial pressure of 3 mmHg.   Assessment and Plan: 1. Persistent afib Dx in October 2021 S/p Afib ablation on 05/31/24 by Dr. Kennyth.  Patient is currently in Afib. Review of office notes show patient seen by endocrinology yesterday and TSH level 0.953 with Free T4 level elevated at 1.6; dosage of synthroid decreased. After discussion with Dr. Kennyth, will assume patient has been persistent and schedule cardioversion since we are in the blanking period. Stop amiodarone  per Dr. Kennyth. Patient is in  agreement with cardioversion. We discussed the procedure cardioversion to try to convert to NSR. We discussed the risks vs benefits of this procedure and how ultimately we cannot predict whether a patient will have early return of arrhythmia post procedure. After discussion, the patient wishes to proceed with cardioversion. Labs will be drawn.    Informed Consent   Shared Decision Making/Informed Consent The risks (stroke, cardiac arrhythmias rarely resulting in the need for a temporary or permanent pacemaker, skin irritation or burns and complications associated with conscious sedation including aspiration, arrhythmia, respiratory failure and death), benefits (restoration of normal sinus rhythm) and alternatives of a direct current cardioversion were explained in detail to Ms. Ridings and she agrees to proceed.       2. CHA2DS2VASc score of 3 Continue Eliquis  without interruption.  3. HTN Stable today.   Follow up 2 weeks after DCCV.    Dorn Heinrich, PA-C [facility] [address] [place], [place]  [address] [phone]   

## 2024-06-28 NOTE — Progress Notes (Addendum)
Primary Care Physician: Nanci Senior, MD Referring Physician: Dr. Wonda  Endocrinologist: Dr. Mirna    Michelle Watkins is a 78 y.o. female with a history of HFimpEF, HTN, hypothyroidism, HLD, and persistent atrial fibrillation. Patient is on Eliquis  5 mg BID for a Chadsvasc score of at least 5.   On follow up 06/28/24, patient is currently in Afib. S/p Afib ablation on 05/31/24 by Dr. Kennyth. She is surprised to find out she is in Afib today; states it does not feel the same as before the ablation. She is taking amiodarone  100 mg daily. Patient notes she was seen by endocrinology yesterday and medication adjusted. No chest pain or SOB. Leg sites healed without issue. No missed doses of anticoagulant.  Today, she denies symptoms of orthopnea, PND, lower extremity edema, dizziness, presyncope, syncope, bleeding, or neurologic sequela. The patient is tolerating medications without difficulties and is otherwise without complaint today.    Past Medical History:  Diagnosis Date   HFrEF (heart failure with reduced ejection fraction) (HCC)    EF 30-35, global HK, low normal RVSF, RVSP 39.6, mild RAE, mod MR // Echo 5/22: EF 69, no RWMA, normal RVSF, mild LAE, trivial MR   Hypertension    Persistent atrial fibrillation (HCC)    Amio Rx//Apixaban  Rx // ZIO monitor 6/22: NSR, average heart rate 57; no pauses or atrial fibrillation; short SVT runs without sustained arrhythmia; PACs/PVCs (<1%)    Thyroid  disease    Past Surgical History:  Procedure Laterality Date   APPENDECTOMY     ATRIAL FIBRILLATION ABLATION N/A 05/31/2024   Procedure: ATRIAL FIBRILLATION ABLATION;  Surgeon: Kennyth Chew, MD;  Location: [facility];  Service: Cardiovascular;  Laterality: N/A;   CARDIOVERSION N/A 08/15/2020   Procedure: CARDIOVERSION;  Surgeon: Francyne Headland, MD;  Location: [facility];  Service: Cardiovascular;  Laterality: N/A;   HYSTERECTOMY ABDOMINAL WITH SALPINGECTOMY      Current  Outpatient Medications  Medication Sig Dispense Refill   acetaminophen  (TYLENOL ) 650 MG CR tablet Take 650 mg by mouth every 8 (eight) hours as needed for pain.     amiodarone  (PACERONE ) 100 MG tablet Take 1 tablet (100 mg total) by mouth daily. 30 tablet 0   ELIQUIS  5 MG TABS tablet TAKE 1 TABLET BY MOUTH TWICE A DAY 180 tablet 1   hydrALAZINE  (APRESOLINE ) 25 MG tablet Take 25 mg by mouth 3 (three) times daily.     loratadine (CLARITIN) 10 MG tablet Take 10 mg by mouth daily as needed for allergies.     metoprolol  tartrate (LOPRESSOR ) 25 MG tablet Take 0.5 tablets (12.5 mg total) by mouth 2 (two) times daily. (Patient taking differently: Take 12.5 mg by mouth 3 (three) times daily.) 45 tablet 3   spironolactone  (ALDACTONE ) 25 MG tablet Take 1 tablet (25 mg total) by mouth daily. 90 tablet 3   SYNTHROID 125 MCG tablet Take 125-250 mcg by mouth See admin instructions. 125 mcg daily every morning, and 250 mcg on Sunday Mornings     valsartan  (DIOVAN ) 80 MG tablet Take 1 tablet (80 mg total) by mouth 2 (two) times daily. 180 tablet 3   No current facility-administered medications for this encounter.    Allergies  Allergen Reactions   Amlodipine  Swelling    LEE   Penicillins Rash   Sulfamethoxazole Rash    ROS- All systems are reviewed and negative except as per the HPI above  Physical Exam: Vitals:   06/28/24 1431  BP: 118/72  Pulse: 94  Weight: 93.6 kg  Height: 5' 2 (1.575 m)   Wt Readings from Last 3 Encounters:  06/28/24 93.6 kg  05/31/24 90.7 kg  03/02/24 93 kg    Labs: Lab Results  Component Value Date   NA 137 05/10/2024   K 4.6 05/10/2024   CL 101 05/10/2024   CO2 20 05/10/2024   GLUCOSE 79 05/10/2024   BUN 19 05/10/2024   CREATININE 1.17 (H) 05/10/2024   CALCIUM 9.2 05/10/2024   No results found for: INR No results found for: CHOL, HDL, LDLCALC, TRIG  GEN- The patient is well appearing, alert and oriented x 3 today.   Neck - no JVD or carotid  bruit noted Lungs- Clear to ausculation bilaterally, normal work of breathing Heart- Irregular rate and rhythm, no murmurs, rubs or gallops, PMI not laterally displaced Extremities- no clubbing, cyanosis, or edema Skin - no rash or ecchymosis noted   ECG Vent. rate 94 BPM PR interval * ms QRS duration 76 ms QT/QTcB 372/465 ms P-R-T axes * -6 55 Atrial fibrillation Low voltage QRS Cannot rule out Anterior infarct , age undetermined Abnormal ECG When compared with ECG of 31-May-2024 09:49, Atrial fibrillation has replaced Sinus rhythm  Echo 04/25/24:  1. Left ventricular ejection fraction, by estimation, is 60 to 65%. The  left ventricle has normal function. The left ventricle has no regional  wall motion abnormalities. There is mild concentric left ventricular  hypertrophy. Left ventricular diastolic  function could not be evaluated.   2. Right ventricular systolic function is normal. The right ventricular  size is normal. There is normal pulmonary artery systolic pressure.   3. Left atrial size was moderately dilated.   4. The mitral valve is abnormal. Mild mitral valve regurgitation. No  evidence of mitral stenosis. Moderate mitral annular calcification.   5. The aortic valve is normal in structure. Aortic valve regurgitation is  not visualized. No aortic stenosis is present.   6. The inferior vena cava is normal in size with greater than 50%  respiratory variability, suggesting right atrial pressure of 3 mmHg.   Assessment and Plan: 1. Persistent afib Dx in October 2021 S/p Afib ablation on 05/31/24 by Dr. Kennyth.  Patient is currently in Afib. Review of office notes show patient seen by endocrinology yesterday and TSH level 0.953 with Free T4 level elevated at 1.6; dosage of synthroid decreased. After discussion with Dr. Kennyth, will assume patient has been persistent and schedule cardioversion since we are in the blanking period. Stop amiodarone  per Dr. Kennyth. Patient is in  agreement with cardioversion. We discussed the procedure cardioversion to try to convert to NSR. We discussed the risks vs benefits of this procedure and how ultimately we cannot predict whether a patient will have early return of arrhythmia post procedure. After discussion, the patient wishes to proceed with cardioversion. Labs will be drawn.    Informed Consent   Shared Decision Making/Informed Consent The risks (stroke, cardiac arrhythmias rarely resulting in the need for a temporary or permanent pacemaker, skin irritation or burns and complications associated with conscious sedation including aspiration, arrhythmia, respiratory failure and death), benefits (restoration of normal sinus rhythm) and alternatives of a direct current cardioversion were explained in detail to Ms. Ridings and she agrees to proceed.       2. CHA2DS2VASc score of 3 Continue Eliquis  without interruption.  3. HTN Stable today.   Follow up 2 weeks after DCCV.    Dorn Heinrich, PA-C [facility] [address] [place], [place]  [address] [phone]   

## 2024-06-29 ENCOUNTER — Other Ambulatory Visit (HOSPITAL_COMMUNITY): Payer: Self-pay | Admitting: *Deleted

## 2024-06-29 DIAGNOSIS — I4819 Other persistent atrial fibrillation: Secondary | ICD-10-CM

## 2024-06-29 NOTE — Progress Notes (Signed)
 Called and spoke with pt per orders from J.Terra PA. Pt was instructed to stop amiodarone , will come for labs 07/02/24 and have DCCV 07/03/24. Reviewed Afib ER precautions if needed prior to procedure. Reviewed DCCV instructions. F/u appt made for 07/16/24. Pt agrees and verbalized understanding.

## 2024-07-02 ENCOUNTER — Other Ambulatory Visit (HOSPITAL_COMMUNITY): Payer: Self-pay | Admitting: *Deleted

## 2024-07-02 DIAGNOSIS — I4819 Other persistent atrial fibrillation: Secondary | ICD-10-CM

## 2024-07-02 NOTE — Progress Notes (Signed)
 Pt called for pre procedure instructions. Arrival time 1130 NPO after midnight explained Instructed to take am meds with sip of water and confirmed blood thinner consistency. Instructed pt need for ride home tomorrow and have responsible adult with them for 24 hrs post procedure.

## 2024-07-03 ENCOUNTER — Ambulatory Visit (HOSPITAL_COMMUNITY): Admitting: Anesthesiology

## 2024-07-03 ENCOUNTER — Ambulatory Visit (HOSPITAL_COMMUNITY): Payer: Self-pay | Admitting: Internal Medicine

## 2024-07-03 ENCOUNTER — Ambulatory Visit (HOSPITAL_COMMUNITY)
Admission: RE | Admit: 2024-07-03 | Discharge: 2024-07-03 | Disposition: A | Attending: Cardiology | Admitting: Cardiology

## 2024-07-03 ENCOUNTER — Encounter (HOSPITAL_COMMUNITY): Payer: Self-pay | Admitting: Cardiology

## 2024-07-03 ENCOUNTER — Other Ambulatory Visit: Payer: Self-pay

## 2024-07-03 ENCOUNTER — Encounter (HOSPITAL_COMMUNITY)
Admission: RE | Disposition: A | Discharge status: Home / Self Care | Payer: Self-pay | Source: Home / Self Care | Attending: Cardiology

## 2024-07-03 DIAGNOSIS — I110 Hypertensive heart disease with heart failure: Secondary | ICD-10-CM | POA: Diagnosis not present

## 2024-07-03 DIAGNOSIS — Z7901 Long term (current) use of anticoagulants: Secondary | ICD-10-CM | POA: Insufficient documentation

## 2024-07-03 DIAGNOSIS — I4819 Other persistent atrial fibrillation: Secondary | ICD-10-CM

## 2024-07-03 DIAGNOSIS — E039 Hypothyroidism, unspecified: Secondary | ICD-10-CM | POA: Insufficient documentation

## 2024-07-03 DIAGNOSIS — I5022 Chronic systolic (congestive) heart failure: Secondary | ICD-10-CM | POA: Insufficient documentation

## 2024-07-03 DIAGNOSIS — I5032 Chronic diastolic (congestive) heart failure: Secondary | ICD-10-CM

## 2024-07-03 HISTORY — PX: CARDIOVERSION: EP1203

## 2024-07-03 LAB — BASIC METABOLIC PANEL WITH GFR
BUN/Creatinine Ratio: 17 (ref 12–28)
BUN: 20 mg/dL (ref 8–27)
CO2: 23 mmol/L (ref 20–29)
Calcium: 9.5 mg/dL (ref 8.7–10.3)
Chloride: 104 mmol/L (ref 96–106)
Creatinine, Ser: 1.2 mg/dL — ABNORMAL HIGH (ref 0.57–1.00)
Glucose: 89 mg/dL (ref 70–99)
Potassium: 4.5 mmol/L (ref 3.5–5.2)
Sodium: 140 mmol/L (ref 134–144)
eGFR: 46 mL/min/1.73 — ABNORMAL LOW (ref >59)

## 2024-07-03 SURGERY — CARDIOVERSION (CATH LAB)
Anesthesia: General

## 2024-07-03 MED ORDER — SODIUM CHLORIDE 0.9 % IV SOLN: INTRAVENOUS | Status: DC

## 2024-07-03 MED ORDER — METOPROLOL TARTRATE 25 MG PO TABS: 12.5000 mg | ORAL_TABLET | Freq: Two times a day (BID) | ORAL | 3 refills | Status: AC

## 2024-07-03 MED ORDER — LIDOCAINE HCL (PF) 2 % IJ SOLN
INTRAMUSCULAR | Status: DC | PRN
  Administered 2024-07-03: 60 mg via INTRADERMAL

## 2024-07-03 MED ORDER — PROPOFOL 10 MG/ML IV BOLUS
INTRAVENOUS | Status: DC | PRN
Start: 2024-07-03 — End: 2024-07-03
  Administered 2024-07-03: 50 mg via INTRAVENOUS

## 2024-07-03 SURGICAL SUPPLY — 1 items: PAD DEFIB RADIO PHYSIO CONN (PAD) ×1 IMPLANT

## 2024-07-03 NOTE — Anesthesia Preprocedure Evaluation (Signed)
 Anesthesia Evaluation  Patient identified by MRN, date of birth, ID band Patient awake    Reviewed: Allergy & Precautions, NPO status , Patient's Chart, lab work & pertinent test results  Airway Mallampati: III  TM Distance: >3 FB Neck ROM: Full    Dental  (+) Dental Advisory Given, Teeth Intact   Pulmonary neg pulmonary ROS   Pulmonary exam normal breath sounds clear to auscultation       Cardiovascular hypertension, Pt. on medications and Pt. on home beta blockers + dysrhythmias (eliquis ) Atrial Fibrillation + Valvular Problems/Murmurs (mild MR) MR  Rhythm:Irregular Rate:Normal  Echo 03/2024  1. Left ventricular ejection fraction, by estimation, is 60 to 65%. The  left ventricle has normal function. The left ventricle has no regional  wall motion abnormalities. There is mild concentric left ventricular  hypertrophy. Left ventricular diastolic  function could not be evaluated.   2. Right ventricular systolic function is normal. The right ventricular  size is normal. There is normal pulmonary artery systolic pressure.   3. Left atrial size was moderately dilated.   4. The mitral valve is abnormal. Mild mitral valve regurgitation. No  evidence of mitral stenosis. Moderate mitral annular calcification.   5. The aortic valve is normal in structure. Aortic valve regurgitation is  not visualized. No aortic stenosis is present.   6. The inferior vena cava is normal in size with greater than 50%  respiratory variability, suggesting right atrial pressure of 3 mmHg.      Neuro/Psych negative neurological ROS  negative psych ROS   GI/Hepatic negative GI ROS, Neg liver ROS,,,  Endo/Other  Hypothyroidism    Renal/GU negative Renal ROS     Musculoskeletal  (+) Arthritis , Osteoarthritis,    Abdominal   Peds  Hematology negative hematology ROS (+)   Anesthesia Other Findings   Reproductive/Obstetrics                               Anesthesia Physical Anesthesia Plan  ASA: 3  Anesthesia Plan: General   Post-op Pain Management: Minimal or no pain anticipated   Induction: Intravenous  PONV Risk Score and Plan: 3 and TIVA and Propofol  infusion  Airway Management Planned: Natural Airway  Additional Equipment: None  Intra-op Plan:   Post-operative Plan:   Informed Consent: I have reviewed the patients History and Physical, chart, labs and discussed the procedure including the risks, benefits and alternatives for the proposed anesthesia with the patient or authorized representative who has indicated his/her understanding and acceptance.     Dental advisory given  Plan Discussed with: CRNA  Anesthesia Plan Comments:          Anesthesia Quick Evaluation

## 2024-07-03 NOTE — Transfer of Care (Signed)
 Immediate Anesthesia Transfer of Care Note  Patient: Michelle Watkins  Procedure(s) Performed: CARDIOVERSION  Patient Location: PACU  Anesthesia Type:MAC  Level of Consciousness: sedated  Airway & Oxygen Therapy: Patient Spontanous Breathing  Post-op Assessment: Report given to RN  Post vital signs: stable  Last Vitals:  Vitals Value Taken Time  BP    Temp    Pulse    Resp    SpO2      Last Pain:  Vitals:   07/03/24 1137  PainSc: 0-No pain         Complications: No notable events documented.

## 2024-07-03 NOTE — CV Procedure (Addendum)
 Procedure:   DCCV  Indication:  Symptomatic atrial fibrillation  Procedure Note:  The patient signed informed consent.  They have had had therapeutic anticoagulation with Eliquis  greater than 3 weeks.  Anesthesia was administered by Dr. Darlyn and Allean Leisure, CRNA.  Adequate airway was maintained throughout and vital followed per protocol.  They were cardioverted x 1 with 200J of biphasic synchronized energy.  They converted to NSR with rate 40s-50s.  There were no apparent complications.  The patient had normal neuro status and respiratory status post procedure with vitals stable as recorded elsewhere.    Given bradycardia after cardioversion, will reduce dose of metoprolol  to 12.5 mg BID, she has been taking TID  Follow up:  They will continue on current medical therapy and follow up with cardiology as scheduled.  Lonni Nanas, MD 07/03/2024 12:34 PM

## 2024-07-03 NOTE — Interval H&P Note (Signed)
 History and Physical Interval Note:  07/03/2024 12:21 PM  Michelle Watkins  has presented today for surgery, with the diagnosis of afib.  The various methods of treatment have been discussed with the patient and family. After consideration of risks, benefits and other options for treatment, the patient has consented to  Procedure(s): CARDIOVERSION (N/A) as a surgical intervention.  The patient's history has been reviewed, patient examined, no change in status, stable for surgery.  I have reviewed the patient's chart and labs.  Questions were answered to the patient's satisfaction.     Lonni LITTIE Nanas

## 2024-07-03 NOTE — Discharge Instructions (Signed)

## 2024-07-03 NOTE — Addendum Note (Signed)
 Encounter addended by: Terra Fairy PARAS, PA-C on: 07/03/2024 9:20 AM  Actions taken: Clinical Note Signed

## 2024-07-04 NOTE — Anesthesia Postprocedure Evaluation (Signed)
 Anesthesia Post Note  Patient: Michelle Watkins  Procedure(s) Performed: CARDIOVERSION     Patient location during evaluation: Cath Lab Anesthesia Type: General Level of consciousness: patient cooperative and awake and alert Pain management: pain level controlled Vital Signs Assessment: post-procedure vital signs reviewed and stable Respiratory status: spontaneous breathing Cardiovascular status: stable Anesthetic complications: no   No notable events documented.  Last Vitals:  Vitals:   07/03/24 1254 07/03/24 1304  BP: 111/70 116/72  Pulse: (!) 52 (!) 55  Resp: 18 15  Temp:    SpO2: 97% 99%    Last Pain:  Vitals:   07/03/24 1304  TempSrc:   PainSc: 0-No pain                 Norleen Pope

## 2024-07-16 ENCOUNTER — Encounter (HOSPITAL_COMMUNITY): Payer: Self-pay | Admitting: Internal Medicine

## 2024-07-16 ENCOUNTER — Ambulatory Visit (HOSPITAL_COMMUNITY)
Admission: RE | Admit: 2024-07-16 | Discharge: 2024-07-16 | Disposition: A | Discharge status: Home / Self Care | Source: Ambulatory Visit | Attending: Internal Medicine | Admitting: Internal Medicine

## 2024-07-16 VITALS — BP 118/60 | HR 63 | Ht 62.0 in | Wt 206.6 lb

## 2024-07-16 DIAGNOSIS — D6869 Other thrombophilia: Secondary | ICD-10-CM

## 2024-07-16 DIAGNOSIS — I4819 Other persistent atrial fibrillation: Secondary | ICD-10-CM

## 2024-07-16 NOTE — Progress Notes (Signed)
 Primary Care Physician: Nanci Senior, MD Referring Physician: Dr. Wonda  Endocrinologist: Dr. Mirna    Michelle Watkins is a 78 y.o. female with a history of HFimpEF, HTN, hypothyroidism, HLD, and persistent atrial fibrillation. Patient is on Eliquis  5 mg BID for a Chadsvasc score of at least 5.   On follow up 06/28/24, patient is currently in Afib. S/p Afib ablation on 05/31/24 by Dr. Kennyth. She is surprised to find out she is in Afib today; states it does not feel the same as before the ablation. She is taking amiodarone  100 mg daily. Patient notes she was seen by endocrinology yesterday and medication adjusted. No chest pain or SOB. Leg sites healed without issue. No missed doses of anticoagulant.  On follow up 07/16/24, patient is currently in NSR. S/p DCCV on 07/03/24. Metoprolol  reduced to 12.5 mg BID due to bradycardia post procedure. She feels better in normal rhythm. No missed doses of OAC.   Today, she denies symptoms of orthopnea, PND, lower extremity edema, dizziness, presyncope, syncope, bleeding, or neurologic sequela. The patient is tolerating medications without difficulties and is otherwise without complaint today.    Past Medical History:  Diagnosis Date   HFrEF (heart failure with reduced ejection fraction) (HCC)    EF 30-35, global HK, low normal RVSF, RVSP 39.6, mild RAE, mod MR // Echo 5/22: EF 69, no RWMA, normal RVSF, mild LAE, trivial MR   Hypertension    Persistent atrial fibrillation (HCC)    Amio Rx//Apixaban  Rx // ZIO monitor 6/22: NSR, average heart rate 57; no pauses or atrial fibrillation; short SVT runs without sustained arrhythmia; PACs/PVCs (<1%)    Thyroid  disease    Past Surgical History:  Procedure Laterality Date   APPENDECTOMY     ATRIAL FIBRILLATION ABLATION N/A 05/31/2024   Procedure: ATRIAL FIBRILLATION ABLATION;  Surgeon: Kennyth Chew, MD;  Location: [facility] INVASIVE CV LAB;  Service: Cardiovascular;  Laterality: N/A;   CARDIOVERSION N/A  08/15/2020   Procedure: CARDIOVERSION;  Surgeon: Francyne Headland, MD;  Location: [facility] ENDOSCOPY;  Service: Cardiovascular;  Laterality: N/A;   CARDIOVERSION N/A 07/03/2024   Procedure: CARDIOVERSION;  Surgeon: Kate Lonni CROME, MD;  Location: [facility] INVASIVE CV LAB;  Service: Cardiovascular;  Laterality: N/A;   HYSTERECTOMY ABDOMINAL WITH SALPINGECTOMY      Current Outpatient Medications  Medication Sig Dispense Refill   acetaminophen  (TYLENOL ) 650 MG CR tablet Take 650 mg by mouth every 8 (eight) hours as needed for pain.     artificial tears ophthalmic solution Place 1 drop into both eyes as needed for dry eyes.     ELIQUIS  5 MG TABS tablet TAKE 1 TABLET BY MOUTH TWICE A DAY 180 tablet 1   hydrALAZINE  (APRESOLINE ) 25 MG tablet Take 25 mg by mouth 3 (three) times daily.     loratadine (CLARITIN) 10 MG tablet Take 10 mg by mouth daily as needed for allergies.     metoprolol  tartrate (LOPRESSOR ) 25 MG tablet Take 0.5 tablets (12.5 mg total) by mouth 2 (two) times daily. 90 tablet 3   spironolactone  (ALDACTONE ) 25 MG tablet Take 1 tablet (25 mg total) by mouth daily. 90 tablet 3   SYNTHROID 125 MCG tablet Take 125 mcg by mouth daily before breakfast.     valsartan  (DIOVAN ) 80 MG tablet Take 1 tablet (80 mg total) by mouth 2 (two) times daily. 180 tablet 3   No current facility-administered medications for this encounter.    Allergies  Allergen Reactions   Amlodipine  Swelling  LEE   Penicillins Rash   Sulfamethoxazole Rash    ROS- All systems are reviewed and negative except as per the HPI above  Physical Exam: Vitals:   07/16/24 1358  BP: 118/60  Pulse: 63  Weight: 93.7 kg  Height: 5' 2 (1.575 m)    Wt Readings from Last 3 Encounters:  07/16/24 93.7 kg  06/28/24 93.6 kg  05/31/24 90.7 kg    Labs: Lab Results  Component Value Date   NA 140 07/02/2024   K 4.5 07/02/2024   CL 104 07/02/2024   CO2 23 07/02/2024   GLUCOSE 89 07/02/2024   BUN 20 07/02/2024    CREATININE 1.20 (H) 07/02/2024   CALCIUM 9.5 07/02/2024   No results found for: INR No results found for: CHOL, HDL, LDLCALC, TRIG  GEN- The patient is well appearing, alert and oriented x 3 today.   Neck - no JVD or carotid bruit noted Lungs- Clear to ausculation bilaterally, normal work of breathing Heart- Regular rate and rhythm, no murmurs, rubs or gallops, PMI not laterally displaced Extremities- no clubbing, cyanosis, or edema Skin - no rash or ecchymosis noted   ECG Vent. rate 63 BPM PR interval 198 ms QRS duration 84 ms QT/QTcB 450/460 ms P-R-T axes 37 -15 18 Normal sinus rhythm Inferior infarct , age undetermined Anterior infarct , age undetermined Abnormal ECG When compared with ECG of 03-Jul-2024 13:01, Previous ECG is present  Echo 04/25/24:  1. Left ventricular ejection fraction, by estimation, is 60 to 65%. The  left ventricle has normal function. The left ventricle has no regional  wall motion abnormalities. There is mild concentric left ventricular  hypertrophy. Left ventricular diastolic  function could not be evaluated.   2. Right ventricular systolic function is normal. The right ventricular  size is normal. There is normal pulmonary artery systolic pressure.   3. Left atrial size was moderately dilated.   4. The mitral valve is abnormal. Mild mitral valve regurgitation. No  evidence of mitral stenosis. Moderate mitral annular calcification.   5. The aortic valve is normal in structure. Aortic valve regurgitation is  not visualized. No aortic stenosis is present.   6. The inferior vena cava is normal in size with greater than 50%  respiratory variability, suggesting right atrial pressure of 3 mmHg.   Assessment and Plan: 1. Persistent afib Dx in October 2021 S/p Afib ablation on 05/31/24 by Dr. Kennyth. S/p DCCV on 07/03/24.  Patient is currently in NSR. She feels better and is happy to be back in normal rhythm. Continue metoprolol  12.5 mg BID.    2. CHA2DS2VASc score of 3 Continue Eliquis  without interruption.  3. HTN Stable today.    Follow up as scheduled with Dr. Kennyth.    Dorn Heinrich, [facility] [address] [place], [place] [address] [phone]

## 2024-07-26 ENCOUNTER — Telehealth: Payer: Self-pay | Admitting: Cardiovascular Disease

## 2024-07-26 DIAGNOSIS — I4819 Other persistent atrial fibrillation: Secondary | ICD-10-CM

## 2024-07-26 MED ORDER — APIXABAN 5 MG PO TABS: 5.0000 mg | ORAL_TABLET | Freq: Two times a day (BID) | ORAL | 1 refills | Status: AC

## 2024-07-26 NOTE — Telephone Encounter (Signed)
*  STAT* If patient is at the pharmacy, call can be transferred to refill team.   1. Which medications need to be refilled? (please list name of each medication and dose if known)   ELIQUIS  5 MG TABS tablet   2. Would you like to learn more about the convenience, safety, & potential cost savings by using the [facility]?   3. Are you open to using the [facility] (Type [facility]. ).  4. Which pharmacy/location (including street and city if local pharmacy) is medication to be sent to?  [id number] - [place], [place] - [address]   5. Do they need a 30 day or 90 day supply?  90 day  Patient stated she will be out of medication next Tuesday, 11/4.  Patient has appointment with Dr. Kennyth, 12/4.

## 2024-07-26 NOTE — Telephone Encounter (Signed)
 Prescription refill request for Eliquis  received. Indication:   A-Fib Last office visit:  03/13/2024 Scr:   1.20  last lab on 07/02/2024 Age: 78 yrs.  Weight: 93.7 kg  Pt meets protocol and pt's medication was sent to pt's pharmacy for 6 mos.

## 2024-08-30 ENCOUNTER — Ambulatory Visit: Admitting: Cardiology

## 2024-09-13 ENCOUNTER — Ambulatory Visit: Admitting: Physician Assistant

## 2024-09-17 NOTE — Progress Notes (Unsigned)
" °  Electrophysiology Office Note:   Date:  09/18/2024  ID:  Michelle Watkins, DOB [birth year], MRN [id number]  Primary Cardiologist: Ozell Fell, MD Primary Heart Failure: None Electrophysiologist: [name], MD      History of Present Illness:   Michelle Watkins is a 78 y.o. female with h/o AF, HTN, HFrEF, HLD, hypothyroidism seen today for routine electrophysiology follow-up s/p Ablation.  She had an AF ablation on 05/31/24 and on AF clinic follow up was in AF.  She underwent cardioversion on 07/03/24 with conversion to SB 40-50's.  Since last being seen in our clinic the patient reports doing well. She has not had any evidence of AF post DCCV. She monitors with a pulse oximeter.  She walks with a rollator. No issues with bleeding on Eliquis .  She notes occasional knee swelling and ankle swelling at times.    She denies chest pain, palpitations, dyspnea, PND, orthopnea, nausea, vomiting, dizziness, syncope, edema, weight gain, or early satiety.    Review of systems complete and found to be negative unless listed in HPI.   EP Information / Studies Reviewed:    EKG is ordered today. Personal review as below.  EKG Interpretation Date/Time:  Tuesday September 18 2024 14:33:32 EST Ventricular Rate:  56 PR Interval:  198 QRS Duration:  84 QT Interval:  450 QTC Calculation: 434 R Axis:   15  Text Interpretation: Sinus bradycardia with Premature supraventricular complexes Confirmed by Aniceto Jarvis ([id number]) on 09/18/2024 2:39:17 PM    Arrhythmia / AAD / Pertinent EP Studies AF > initial dx 06/2020 Amiodarone  2022 > 06/28/2024  EPS 05/31/24 > s/p PVI +PW ablation DCCV 07/03/24 > 200j with conversion to SB 40-50's   Risk Assessment/Calculations:    CHA2DS2-VASc Score = 5   This indicates a 7.2% annual risk of stroke. The patient's score is based upon: CHF History: 1 HTN History: 1 Diabetes History: 0 Stroke History: 0 Vascular Disease History: 0 Age Score: 2 Gender Score: 1              Physical Exam:   VS:  BP 136/66 (BP Location: Left Arm, Patient Position: Sitting, Cuff Size: Normal)   Pulse (!) 56   Ht 5' 2 (1.575 m)   Wt 209 lb (94.8 kg)   SpO2 97%   BMI 38.23 kg/m    Wt Readings from Last 3 Encounters:  09/18/24 209 lb (94.8 kg)  07/16/24 206 lb 9.6 oz (93.7 kg)  06/28/24 206 lb 6.4 oz (93.6 kg)     GEN: Well nourished, well developed in no acute distress NECK: No JVD; No carotid bruits CARDIAC: Regular rate and rhythm, no murmurs, rubs, gallops RESPIRATORY:  Clear to auscultation without rales, wheezing or rhonchi  ABDOMEN: Soft, non-tender, non-distended EXTREMITIES:  trace ankle edema; No deformity   ASSESSMENT AND PLAN:    Persistent atrial Fibrillation  CHA2DS2-VASc 5, s/p PVI + PW ablation. LVEF 60-65% 03/2024. -EKG with NSR, few PAC  -continue Metoprolol  12.5 mg BID  -no further AF symptom burden  -monitors with pulse-oximeter   Secondary Hypercoagulable State  -continue Eliquis  5mg  BID, dose reviewed and appropriate by age / wt   Hypertension  -well controlled on current regimen   Trace Ankle Edema  -discussed compression stockings  Follow up with EP APP in 6 months  Signed, Jarvis Aniceto, NP-C, AGACNP-BC [facility] - Electrophysiology  09/18/2024, 2:58 PM  "

## 2024-09-18 ENCOUNTER — Encounter: Payer: Self-pay | Admitting: Pulmonary Disease

## 2024-09-18 ENCOUNTER — Ambulatory Visit: Admitting: Pulmonary Disease

## 2024-09-18 VITALS — BP 136/66 | HR 56 | Ht 62.0 in | Wt 209.0 lb

## 2024-09-18 DIAGNOSIS — D6869 Other thrombophilia: Secondary | ICD-10-CM

## 2024-09-18 DIAGNOSIS — I4819 Other persistent atrial fibrillation: Secondary | ICD-10-CM | POA: Diagnosis not present

## 2024-09-18 DIAGNOSIS — I10 Essential (primary) hypertension: Secondary | ICD-10-CM | POA: Diagnosis not present

## 2024-09-18 NOTE — Patient Instructions (Signed)
 Medication Instructions:  No medications changes today   *If you need a refill on your cardiac medications before your next appointment, please call your pharmacy*  Lab Work: No lab work today If you have labs (blood work) drawn today and your tests are completely normal, you will receive your results only by: MyChart Message (if you have MyChart) OR A paper copy in the mail If you have any lab test that is abnormal or we need to change your treatment, we will call you to review the results.  Testing/Procedures: No testing/procedures were scheduled today  Follow-Up: At [facility], you and your health needs are our priority.  As part of our continuing mission to provide you with exceptional heart care, our providers are all part of one team.  This team includes your primary Cardiologist (physician) and Advanced Practice Providers or APPs (Physician Assistants and Nurse Practitioners) who all work together to provide you with the care you need, when you need it.  Your next appointment:   6 month(s)  Provider:   You may see Michelle Kitty, MD or one of the following Advanced Practice Providers on your designated Care Team:    Michelle Barrack, NP    We recommend signing up for the patient portal called MyChart.  Sign up information is provided on this After Visit Summary.  MyChart is used to connect with patients for Virtual Visits (Telemedicine).  Patients are able to view lab/test results, encounter notes, upcoming appointments, etc.  Non-urgent messages can be sent to your provider as well.   To learn more about what you can do with MyChart, go to [email or web address].
